# Patient Record
Sex: Female | Born: 1950 | Race: White | Hispanic: No | Marital: Married | Smoking: Never smoker
Health system: Southern US, Community
[De-identification: ages and names within clinical notes are randomized; demographics above are authoritative.]

## PROBLEM LIST (undated history)

## (undated) HISTORY — PX: TONSILLECTOMY: SUR1361

## (undated) HISTORY — PX: OOPHORECTOMY: SHX86

---

## 2016-04-20 DIAGNOSIS — M8580 Other specified disorders of bone density and structure, unspecified site: Secondary | ICD-10-CM | POA: Insufficient documentation

## 2016-07-26 DIAGNOSIS — N83201 Unspecified ovarian cyst, right side: Secondary | ICD-10-CM | POA: Insufficient documentation

## 2016-09-26 DIAGNOSIS — L570 Actinic keratosis: Secondary | ICD-10-CM | POA: Insufficient documentation

## 2016-09-26 DIAGNOSIS — H903 Sensorineural hearing loss, bilateral: Secondary | ICD-10-CM | POA: Insufficient documentation

## 2016-09-26 DIAGNOSIS — H60549 Acute eczematoid otitis externa, unspecified ear: Secondary | ICD-10-CM | POA: Insufficient documentation

## 2016-10-01 LAB — HM MAMMOGRAPHY

## 2017-09-30 DIAGNOSIS — H3530 Unspecified macular degeneration: Secondary | ICD-10-CM | POA: Insufficient documentation

## 2017-10-16 LAB — HM DEXA SCAN

## 2017-10-16 LAB — HM MAMMOGRAPHY

## 2018-12-28 LAB — HM MAMMOGRAPHY

## 2019-11-01 LAB — HM DEXA SCAN

## 2019-12-30 LAB — HM MAMMOGRAPHY

## 2020-06-30 ENCOUNTER — Encounter (INDEPENDENT_AMBULATORY_CARE_PROVIDER_SITE_OTHER): Payer: Self-pay | Admitting: Ophthalmology

## 2020-07-03 ENCOUNTER — Other Ambulatory Visit: Payer: Self-pay

## 2020-07-03 ENCOUNTER — Encounter (INDEPENDENT_AMBULATORY_CARE_PROVIDER_SITE_OTHER): Payer: Medicare Other | Admitting: Ophthalmology

## 2020-07-03 DIAGNOSIS — H2513 Age-related nuclear cataract, bilateral: Secondary | ICD-10-CM | POA: Diagnosis not present

## 2020-07-03 DIAGNOSIS — H353132 Nonexudative age-related macular degeneration, bilateral, intermediate dry stage: Secondary | ICD-10-CM | POA: Diagnosis not present

## 2020-07-03 DIAGNOSIS — H43813 Vitreous degeneration, bilateral: Secondary | ICD-10-CM | POA: Diagnosis not present

## 2020-09-27 ENCOUNTER — Other Ambulatory Visit: Payer: Self-pay

## 2020-09-27 ENCOUNTER — Encounter (INDEPENDENT_AMBULATORY_CARE_PROVIDER_SITE_OTHER): Payer: Medicare Other | Admitting: Ophthalmology

## 2020-09-27 DIAGNOSIS — H2513 Age-related nuclear cataract, bilateral: Secondary | ICD-10-CM | POA: Diagnosis not present

## 2020-09-27 DIAGNOSIS — H43813 Vitreous degeneration, bilateral: Secondary | ICD-10-CM | POA: Diagnosis not present

## 2020-09-27 DIAGNOSIS — H353132 Nonexudative age-related macular degeneration, bilateral, intermediate dry stage: Secondary | ICD-10-CM

## 2020-11-06 ENCOUNTER — Other Ambulatory Visit: Payer: Self-pay | Admitting: Urgent Care

## 2020-11-06 DIAGNOSIS — Z1231 Encounter for screening mammogram for malignant neoplasm of breast: Secondary | ICD-10-CM

## 2020-11-24 ENCOUNTER — Encounter (INDEPENDENT_AMBULATORY_CARE_PROVIDER_SITE_OTHER): Payer: Medicare Other | Admitting: Ophthalmology

## 2020-11-24 ENCOUNTER — Other Ambulatory Visit: Payer: Self-pay

## 2020-11-24 DIAGNOSIS — H353132 Nonexudative age-related macular degeneration, bilateral, intermediate dry stage: Secondary | ICD-10-CM

## 2020-11-24 DIAGNOSIS — H43813 Vitreous degeneration, bilateral: Secondary | ICD-10-CM | POA: Diagnosis not present

## 2020-11-24 DIAGNOSIS — H2513 Age-related nuclear cataract, bilateral: Secondary | ICD-10-CM | POA: Diagnosis not present

## 2021-01-01 ENCOUNTER — Encounter (INDEPENDENT_AMBULATORY_CARE_PROVIDER_SITE_OTHER): Payer: Medicare Other | Admitting: Ophthalmology

## 2021-01-05 ENCOUNTER — Encounter (INDEPENDENT_AMBULATORY_CARE_PROVIDER_SITE_OTHER): Payer: Medicare Other | Admitting: Ophthalmology

## 2021-01-05 ENCOUNTER — Other Ambulatory Visit: Payer: Self-pay

## 2021-01-05 DIAGNOSIS — H33301 Unspecified retinal break, right eye: Secondary | ICD-10-CM | POA: Diagnosis not present

## 2021-01-05 DIAGNOSIS — H2513 Age-related nuclear cataract, bilateral: Secondary | ICD-10-CM | POA: Diagnosis not present

## 2021-01-05 DIAGNOSIS — H353132 Nonexudative age-related macular degeneration, bilateral, intermediate dry stage: Secondary | ICD-10-CM

## 2021-01-05 DIAGNOSIS — H43811 Vitreous degeneration, right eye: Secondary | ICD-10-CM

## 2021-01-23 ENCOUNTER — Other Ambulatory Visit: Payer: Self-pay

## 2021-01-23 ENCOUNTER — Encounter (INDEPENDENT_AMBULATORY_CARE_PROVIDER_SITE_OTHER): Payer: Medicare Other | Admitting: Ophthalmology

## 2021-01-23 DIAGNOSIS — H33301 Unspecified retinal break, right eye: Secondary | ICD-10-CM

## 2021-05-23 ENCOUNTER — Encounter (INDEPENDENT_AMBULATORY_CARE_PROVIDER_SITE_OTHER): Payer: Medicare Other | Admitting: Ophthalmology

## 2021-05-23 ENCOUNTER — Other Ambulatory Visit: Payer: Self-pay

## 2021-05-23 DIAGNOSIS — H353132 Nonexudative age-related macular degeneration, bilateral, intermediate dry stage: Secondary | ICD-10-CM

## 2021-05-23 DIAGNOSIS — H33301 Unspecified retinal break, right eye: Secondary | ICD-10-CM

## 2021-05-23 DIAGNOSIS — H43813 Vitreous degeneration, bilateral: Secondary | ICD-10-CM

## 2021-07-03 ENCOUNTER — Encounter (INDEPENDENT_AMBULATORY_CARE_PROVIDER_SITE_OTHER): Payer: Medicare Other | Admitting: Ophthalmology

## 2021-10-17 ENCOUNTER — Other Ambulatory Visit (HOSPITAL_COMMUNITY)
Admission: RE | Admit: 2021-10-17 | Discharge: 2021-10-17 | Disposition: A | Discharge status: Home / Self Care | Payer: Medicare Other | Source: Ambulatory Visit | Attending: Family Medicine | Admitting: Family Medicine

## 2021-10-17 ENCOUNTER — Emergency Department
Admission: EM | Admit: 2021-10-17 | Discharge: 2021-10-17 | Disposition: A | Discharge status: Home / Self Care | Payer: Medicare Other | Source: Home / Self Care | Attending: Family Medicine | Admitting: Family Medicine

## 2021-10-17 ENCOUNTER — Other Ambulatory Visit: Payer: Self-pay

## 2021-10-17 DIAGNOSIS — N72 Inflammatory disease of cervix uteri: Secondary | ICD-10-CM | POA: Insufficient documentation

## 2021-10-17 MED ORDER — METRONIDAZOLE 500 MG PO TABS: 500.0000 mg | ORAL_TABLET | Freq: Two times a day (BID) | ORAL | 0 refills | Status: DC

## 2021-10-17 NOTE — ED Triage Notes (Signed)
Pt states that she has some vaginal discharge. X2 days ? ?Pt states that she notices she has the brownish discharge more in the morning when she wakes up. ?

## 2021-10-17 NOTE — ED Provider Notes (Signed)
?[facility] ? ? ? ?CSN: [id number] ?Arrival date & time: 10/17/21  1751 ? ? ?  ? ?History   ?Chief Complaint ?Chief Complaint  ?Patient presents with  ? Vaginal Discharge  ?  Vaginal discharge. X2 days  ? ? ?HPI ?Christy Cruz is a 71 y.o. female.  ? ?HPI ?Pleasant 71 year old woman in good health.  States she had menopause at the age of 74.  She has had an oophorectomy for an ovarian cyst.  She is on no prescription medications, married, retired in [place]. ?She is here because for the last 4 days she has had vaginal discharge.  It is a brown discharge, copious in the morning.  She has been wearing a pad and it is stained.  There is no itching, no odor, no discomfort.  She has never had this previously. ?She denies any abdominal pain.  No urinary symptoms.  No fever or chills.  No malaise ?History reviewed. No pertinent past medical history. ? ?There are no problems to display for this patient. ? ? ?Past Surgical History:  ?Procedure Laterality Date  ? OOPHORECTOMY    ? TONSILLECTOMY    ? ? ?OB History   ?No obstetric history on file. ?  ? ? ? ?Home Medications   ? ?Prior to Admission medications   ?Medication Sig Start Date End Date Taking? Authorizing Provider  ?glucosamine-chondroitin 500-400 MG tablet Take 1 tablet by mouth 3 (three) times daily.   Yes [provider]  ?metroNIDAZOLE (FLAGYL) 500 MG tablet Take 1 tablet (500 mg total) by mouth 2 (two) times daily. 10/17/21  Yes Raylene Everts, MD  ?Multiple Vitamins-Minerals (ICAPS AREDS 2 PO) ICaps AREDS ? one PO daily   Yes [provider]  ?Omega-3 Fatty Acids (FISH OIL) 1200 MG CAPS 1 capsule   Yes [provider]  ? ? ?Family History ?History reviewed. No pertinent family history. ? ?Social History ?Social History  ? ?Tobacco Use  ? Smoking status: Never  ? Smokeless tobacco: Never  ?Substance Use Topics  ? Alcohol use: Never  ? Drug use: Never  ? ? ? ?Allergies   ?Patient has no known allergies. ? ? ?Review of  Systems ?Review of Systems ?See HPI ? ?Physical Exam ?Triage Vital Signs ?ED Triage Vitals  ?Enc Vitals Group  ?   BP 10/17/21 0956 133/84  ?   Pulse Rate 10/17/21 0956 83  ?   Resp 10/17/21 0956 18  ?   Temp 10/17/21 0956 98.8 ?F (37.1 ?C)  ?   Temp Source 10/17/21 0956 Oral  ?   SpO2 10/17/21 0956 100 %  ?   Weight 10/17/21 0952 120 lb (54.4 kg)  ?   Height 10/17/21 0952 '5\' 5"'$  (1.651 m)  ?   Head Circumference --   ?   Peak Flow --   ?   Pain Score 10/17/21 0952 0  ?   Pain Loc --   ?   Pain Edu? --   ?   Excl. in [place]? --   ? ?No data found. ? ?Updated Vital Signs ?BP 133/84 (BP Location: Right Arm)   Pulse 83   Temp 98.8 ?F (37.1 ?C) (Oral)   Resp 18   Ht '5\' 5"'$  (1.651 m)   Wt 54.4 kg   SpO2 100%   BMI 19.97 kg/m?  ? ?   ?Physical Exam ?Constitutional:   ?   General: She is not in acute distress. ?   Appearance: She is well-developed  and normal weight.  ?HENT:  ?   Head: Normocephalic and atraumatic.  ?   Mouth/Throat:  ?   Comments: Mask is in place ?Eyes:  ?   Conjunctiva/sclera: Conjunctivae normal.  ?   Pupils: Pupils are equal, round, and reactive to light.  ?Cardiovascular:  ?   Rate and Rhythm: Normal rate.  ?Pulmonary:  ?   Effort: Pulmonary effort is normal. No respiratory distress.  ?Abdominal:  ?   General: Abdomen is flat. There is no distension.  ?   Palpations: Abdomen is soft.  ?   Tenderness: There is no abdominal tenderness.  ?Genitourinary: ?   General: Normal vulva.  ?   Exam position: Lithotomy position.  ?   Vagina: Vaginal discharge present.  ?   Comments: Erythema at urethral meatus.  External genitalia normal.  Atrophic changes are evident.  External hemorrhoids are present, nontender.  No bleeding.  Vaginal exam reveals copious thin brown discharge.  Heme positive.  Sent for Gram stain and culture.  ?Musculoskeletal:     ?   General: Normal range of motion.  ?   Cervical back: Normal range of motion.  ?Skin: ?   General: Skin is warm and dry.  ?Neurological:  ?   Mental Status: She  is alert.  ?Psychiatric:     ?   Mood and Affect: Mood normal.     ?   Behavior: Behavior normal.  ? ? ? ?UC Treatments / Results  ?Labs ?(all labs ordered are listed, but only abnormal results are displayed) ?Labs Reviewed  ?BODY FLUID CULTURE W GRAM STAIN  ?CERVICOVAGINAL ANCILLARY ONLY  ? ? ?EKG ? ? ?Radiology ?No results found. ? ?Procedures ?Procedures (including critical care time) ? ?Medications Ordered in UC ?Medications - No data to display ? ?Initial Impression / Assessment and Plan / UC Course  ?I have reviewed the triage vital signs and the nursing notes. ? ?Pertinent labs & imaging results that were available during my care of the patient were reviewed by me and considered in my medical decision making (see chart for details). ? ?  ? ?I discussed with the patient that her cervix appeared to have nabothian cysts and was irregular.  It was also friable.  Vaginal walls were unremarkable.  She has no abdominal tenderness to suggest endometritis.  We will send cultures and treat empirically with metronidazole.  May need to follow-up with GYN if problem persists ?Final Clinical Impressions(s) / UC Diagnoses  ? ?Final diagnoses:  ?Cervicitis  ? ? ? ?Discharge Instructions   ? ?  ?I am prescribing metronidazole ?This is used for general bacterial vaginitis ?The test results will be available in 2-3 days ?You can see results in My Chart ?Follow up with GYN if problem persists ? ? ?ED Prescriptions   ? ? Medication Sig Dispense Auth. Provider  ? metroNIDAZOLE (FLAGYL) 500 MG tablet Take 1 tablet (500 mg total) by mouth 2 (two) times daily. 14 tablet Raylene Everts, MD  ? ?  ? ?PDMP not reviewed this encounter. ?  ?Raylene Everts, MD ?10/17/21 1108 ? ?

## 2021-10-17 NOTE — Discharge Instructions (Signed)
I am prescribing metronidazole ?This is used for general bacterial vaginitis ?The test results will be available in 2-3 days ?You can see results in My Chart ?Follow up with GYN if problem persists ?

## 2021-10-18 LAB — CERVICOVAGINAL ANCILLARY ONLY
Bacterial Vaginitis (gardnerella): NEGATIVE
Candida Glabrata: NEGATIVE
Candida Vaginitis: NEGATIVE
Chlamydia: NEGATIVE
Comment: NEGATIVE
Comment: NEGATIVE
Comment: NEGATIVE
Comment: NEGATIVE
Comment: NEGATIVE
Comment: NORMAL
Neisseria Gonorrhea: NEGATIVE
Trichomonas: NEGATIVE

## 2021-10-22 ENCOUNTER — Telehealth: Payer: Self-pay | Admitting: Family Medicine

## 2021-10-22 ENCOUNTER — Encounter (HOSPITAL_BASED_OUTPATIENT_CLINIC_OR_DEPARTMENT_OTHER): Payer: Self-pay | Admitting: Emergency Medicine

## 2021-10-22 ENCOUNTER — Other Ambulatory Visit: Payer: Self-pay

## 2021-10-22 ENCOUNTER — Emergency Department (HOSPITAL_BASED_OUTPATIENT_CLINIC_OR_DEPARTMENT_OTHER): Payer: Medicare Other

## 2021-10-22 ENCOUNTER — Inpatient Hospital Stay (HOSPITAL_BASED_OUTPATIENT_CLINIC_OR_DEPARTMENT_OTHER)
Admission: EM | Admit: 2021-10-22 | Discharge: 2021-10-25 | DRG: 872 | Disposition: A | Discharge status: Home / Self Care | Payer: Medicare Other | Attending: Internal Medicine | Admitting: Internal Medicine

## 2021-10-22 DIAGNOSIS — L03116 Cellulitis of left lower limb: Secondary | ICD-10-CM | POA: Diagnosis present

## 2021-10-22 DIAGNOSIS — G8929 Other chronic pain: Secondary | ICD-10-CM | POA: Diagnosis present

## 2021-10-22 DIAGNOSIS — D72829 Elevated white blood cell count, unspecified: Secondary | ICD-10-CM | POA: Diagnosis not present

## 2021-10-22 DIAGNOSIS — A419 Sepsis, unspecified organism: Secondary | ICD-10-CM | POA: Diagnosis present

## 2021-10-22 DIAGNOSIS — R000 Tachycardia, unspecified: Secondary | ICD-10-CM | POA: Diagnosis present

## 2021-10-22 DIAGNOSIS — E872 Acidosis, unspecified: Secondary | ICD-10-CM | POA: Diagnosis present

## 2021-10-22 DIAGNOSIS — Z20822 Contact with and (suspected) exposure to covid-19: Secondary | ICD-10-CM | POA: Diagnosis present

## 2021-10-22 DIAGNOSIS — L0390 Cellulitis, unspecified: Secondary | ICD-10-CM | POA: Diagnosis present

## 2021-10-22 DIAGNOSIS — D696 Thrombocytopenia, unspecified: Secondary | ICD-10-CM | POA: Diagnosis present

## 2021-10-22 DIAGNOSIS — M79605 Pain in left leg: Secondary | ICD-10-CM | POA: Diagnosis not present

## 2021-10-22 DIAGNOSIS — D649 Anemia, unspecified: Secondary | ICD-10-CM | POA: Diagnosis present

## 2021-10-22 DIAGNOSIS — I728 Aneurysm of other specified arteries: Secondary | ICD-10-CM | POA: Diagnosis present

## 2021-10-22 DIAGNOSIS — G43909 Migraine, unspecified, not intractable, without status migrainosus: Secondary | ICD-10-CM | POA: Diagnosis present

## 2021-10-22 DIAGNOSIS — M7989 Other specified soft tissue disorders: Secondary | ICD-10-CM | POA: Diagnosis present

## 2021-10-22 DIAGNOSIS — M542 Cervicalgia: Secondary | ICD-10-CM | POA: Diagnosis present

## 2021-10-22 DIAGNOSIS — Z79899 Other long term (current) drug therapy: Secondary | ICD-10-CM | POA: Diagnosis not present

## 2021-10-22 DIAGNOSIS — I959 Hypotension, unspecified: Secondary | ICD-10-CM | POA: Diagnosis present

## 2021-10-22 DIAGNOSIS — N898 Other specified noninflammatory disorders of vagina: Secondary | ICD-10-CM | POA: Diagnosis present

## 2021-10-22 DIAGNOSIS — R6520 Severe sepsis without septic shock: Secondary | ICD-10-CM | POA: Diagnosis present

## 2021-10-22 DIAGNOSIS — L538 Other specified erythematous conditions: Secondary | ICD-10-CM | POA: Diagnosis not present

## 2021-10-22 DIAGNOSIS — R112 Nausea with vomiting, unspecified: Secondary | ICD-10-CM

## 2021-10-22 LAB — URINALYSIS, ROUTINE W REFLEX MICROSCOPIC
Bilirubin Urine: NEGATIVE
Glucose, UA: NEGATIVE mg/dL
Hgb urine dipstick: NEGATIVE
Ketones, ur: NEGATIVE mg/dL
Nitrite: NEGATIVE
Specific Gravity, Urine: 1.045 — ABNORMAL HIGH (ref 1.005–1.030)
pH: 8 (ref 5.0–8.0)

## 2021-10-22 LAB — COMPREHENSIVE METABOLIC PANEL
ALT: 14 U/L (ref 0–44)
AST: 24 U/L (ref 15–41)
Albumin: 4.6 g/dL (ref 3.5–5.0)
Alkaline Phosphatase: 71 U/L (ref 38–126)
Anion gap: 13 (ref 5–15)
BUN: 16 mg/dL (ref 8–23)
CO2: 20 mmol/L — ABNORMAL LOW (ref 22–32)
Calcium: 9.3 mg/dL (ref 8.9–10.3)
Chloride: 104 mmol/L (ref 98–111)
Creatinine, Ser: 0.83 mg/dL (ref 0.44–1.00)
GFR, Estimated: >60 mL/min (ref >60)
Glucose, Bld: 83 mg/dL (ref 70–99)
Potassium: 4.8 mmol/L (ref 3.5–5.1)
Sodium: 137 mmol/L (ref 135–145)
Total Bilirubin: 1.5 mg/dL — ABNORMAL HIGH (ref 0.3–1.2)
Total Protein: 7.9 g/dL (ref 6.5–8.1)

## 2021-10-22 LAB — LIPASE, BLOOD: Lipase: 32 U/L (ref 11–51)

## 2021-10-22 LAB — RESP PANEL BY RT-PCR (FLU A&B, COVID) ARPGX2
Influenza A by PCR: NEGATIVE
Influenza B by PCR: NEGATIVE
SARS Coronavirus 2 by RT PCR: NEGATIVE

## 2021-10-22 LAB — CBC
HCT: 46.5 % — ABNORMAL HIGH (ref 36.0–46.0)
Hemoglobin: 14.9 g/dL (ref 12.0–15.0)
MCH: 29 pg (ref 26.0–34.0)
MCHC: 32 g/dL (ref 30.0–36.0)
MCV: 90.5 fL (ref 80.0–100.0)
Platelets: 157 10*3/uL (ref 150–400)
RBC: 5.14 MIL/uL — ABNORMAL HIGH (ref 3.87–5.11)
RDW: 13.3 % (ref 11.5–15.5)
WBC: 14.7 10*3/uL — ABNORMAL HIGH (ref 4.0–10.5)
nRBC: 0 % (ref 0.0–0.2)

## 2021-10-22 LAB — LACTIC ACID, PLASMA
Lactic Acid, Venous: 2.9 mmol/L (ref 0.5–1.9)
Lactic Acid, Venous: 3.5 mmol/L (ref 0.5–1.9)
Lactic Acid, Venous: 3.7 mmol/L (ref 0.5–1.9)

## 2021-10-22 LAB — DIFFERENTIAL
Abs Immature Granulocytes: 0.08 10*3/uL — ABNORMAL HIGH (ref 0.00–0.07)
Basophils Absolute: 0 10*3/uL (ref 0.0–0.1)
Basophils Relative: 0 %
Eosinophils Absolute: 0 10*3/uL (ref 0.0–0.5)
Eosinophils Relative: 0 %
Immature Granulocytes: 1 %
Lymphocytes Relative: 2 %
Lymphs Abs: 0.3 10*3/uL — ABNORMAL LOW (ref 0.7–4.0)
Monocytes Absolute: 0.9 10*3/uL (ref 0.1–1.0)
Monocytes Relative: 6 %
Neutro Abs: 13.1 10*3/uL — ABNORMAL HIGH (ref 1.7–7.7)
Neutrophils Relative %: 91 %

## 2021-10-22 LAB — PROTIME-INR
INR: 1 (ref 0.8–1.2)
Prothrombin Time: 12.9 seconds (ref 11.4–15.2)

## 2021-10-22 LAB — APTT: aPTT: 26 seconds (ref 24–36)

## 2021-10-22 MED ORDER — FAMOTIDINE IN NACL 20-0.9 MG/50ML-% IV SOLN
20.0000 mg | Freq: Once | INTRAVENOUS | Status: AC
  Administered 2021-10-22: 20 mg via INTRAVENOUS
  Filled 2021-10-22: qty 50

## 2021-10-22 MED ORDER — LIDOCAINE VISCOUS HCL 2 % MT SOLN
15.0000 mL | Freq: Once | OROMUCOSAL | Status: AC
Start: 2021-10-22 — End: 2021-10-22
  Administered 2021-10-22: 15 mL via ORAL
  Filled 2021-10-22: qty 15

## 2021-10-22 MED ORDER — ACETAMINOPHEN 325 MG PO TABS: 650.0000 mg | ORAL_TABLET | Freq: Four times a day (QID) | ORAL | Status: DC | PRN

## 2021-10-22 MED ORDER — ENOXAPARIN SODIUM 40 MG/0.4ML IJ SOSY
40.0000 mg | PREFILLED_SYRINGE | INTRAMUSCULAR | Status: DC
  Administered 2021-10-23 – 2021-10-24 (×2): 40 mg via SUBCUTANEOUS
  Filled 2021-10-22 (×2): qty 0.4

## 2021-10-22 MED ORDER — ONDANSETRON HCL 4 MG/2ML IJ SOLN
4.0000 mg | Freq: Once | INTRAMUSCULAR | Status: AC
Start: 2021-10-22 — End: 2021-10-22
  Administered 2021-10-22: 4 mg via INTRAVENOUS
  Filled 2021-10-22: qty 2

## 2021-10-22 MED ORDER — ONDANSETRON 4 MG PO TBDP
4.0000 mg | ORAL_TABLET | Freq: Once | ORAL | Status: AC
  Administered 2021-10-22: 4 mg via ORAL
  Filled 2021-10-22: qty 1

## 2021-10-22 MED ORDER — ALUM & MAG HYDROXIDE-SIMETH 200-200-20 MG/5ML PO SUSP
30.0000 mL | Freq: Once | ORAL | Status: AC
Start: 2021-10-22 — End: 2021-10-22
  Administered 2021-10-22: 30 mL via ORAL
  Filled 2021-10-22: qty 30

## 2021-10-22 MED ORDER — PENICILLIN V POTASSIUM 500 MG PO TABS: 500.0000 mg | ORAL_TABLET | Freq: Two times a day (BID) | ORAL | 0 refills | Status: DC

## 2021-10-22 MED ORDER — LACTATED RINGERS IV BOLUS
1000.0000 mL | Freq: Once | INTRAVENOUS | Status: AC
Start: 2021-10-22 — End: 2021-10-22
  Administered 2021-10-22: 1000 mL via INTRAVENOUS

## 2021-10-22 MED ORDER — LACTATED RINGERS IV SOLN: INTRAVENOUS | Status: AC

## 2021-10-22 MED ORDER — KETOROLAC TROMETHAMINE 15 MG/ML IJ SOLN
15.0000 mg | Freq: Once | INTRAMUSCULAR | Status: AC
  Administered 2021-10-22: 15 mg via INTRAVENOUS
  Filled 2021-10-22: qty 1

## 2021-10-22 MED ORDER — SODIUM CHLORIDE 0.9 % IV BOLUS
500.0000 mL | Freq: Once | INTRAVENOUS | Status: AC
  Administered 2021-10-22: 500 mL via INTRAVENOUS

## 2021-10-22 MED ORDER — SODIUM CHLORIDE 0.9 % IV SOLN
1.0000 g | INTRAVENOUS | Status: DC
  Administered 2021-10-22: 1 g via INTRAVENOUS
  Filled 2021-10-22: qty 10

## 2021-10-22 MED ORDER — ONDANSETRON HCL 4 MG/2ML IJ SOLN
4.0000 mg | Freq: Once | INTRAMUSCULAR | Status: AC
  Administered 2021-10-22: 4 mg via INTRAVENOUS
  Filled 2021-10-22: qty 2

## 2021-10-22 MED ORDER — ONDANSETRON HCL 4 MG/2ML IJ SOLN: 4.0000 mg | Freq: Four times a day (QID) | INTRAMUSCULAR | Status: DC | PRN

## 2021-10-22 MED ORDER — IOHEXOL 350 MG/ML SOLN
100.0000 mL | Freq: Once | INTRAVENOUS | Status: AC | PRN
  Administered 2021-10-22: 60 mL via INTRAVENOUS

## 2021-10-22 MED ORDER — ACETAMINOPHEN 325 MG PO TABS
650.0000 mg | ORAL_TABLET | Freq: Once | ORAL | Status: AC
  Administered 2021-10-22: 650 mg via ORAL
  Filled 2021-10-22: qty 2

## 2021-10-22 NOTE — Progress Notes (Signed)
RN notified on call provider Dr. Royal Piedra of critical Lactic Acid of 3.7 . ?

## 2021-10-22 NOTE — Progress Notes (Signed)
[facility] monitoring code sepsis 

## 2021-10-22 NOTE — ED Notes (Signed)
Patient transported to CT 

## 2021-10-22 NOTE — ED Provider Notes (Signed)
?[facility] ?Provider Note ? ? ?CSN: [id number] ?Arrival date & time: 10/22/21  1349 ? ?  ? ?History ? ?Chief Complaint  ?Patient presents with  ? Emesis  ? ? ?Christy Cruz is a 71 y.o. female. ? ?This is a 71 y.o. female with significant medical history as below, including tonsillectomy, oophorectomy who presents to the ED with complaint of left leg pain, nausea and vomiting.  ? ?Patient was seen urgent care recently, initially started on Flagyl for possible BV.  Was then transition to penicillin for possible UTI.  She picked up penicillin today but not started taking it.  She has been having ongoing nausea and vomiting.  Poor p.o. intake last 24 to 36 hours.  2 days ago noticed cut to her left leg, progressive redness, discomfort to the leg since then.  History of cellulitis.  Her tetanus is up-to-date.  She has been ambulatory but having some discomfort to the leg.  Vague abdominal discomfort.  No suspicious p.o. intake.  No recent travel or sick contacts.  No significant dysuria or hematuria.  No chest pain or dyspnea ? ? ? ?History reviewed. No pertinent past medical history. ? ?Past Surgical History: ?No date: OOPHORECTOMY ?No date: TONSILLECTOMY  ? ? ?The history is provided by the patient. No language interpreter was used.  ?Emesis ?Associated symptoms: abdominal pain   ?Associated symptoms: no chills, no cough, no fever and no headaches   ? ?  ? ?Home Medications ?Prior to Admission medications   ?Medication Sig Start Date End Date Taking? Authorizing Provider  ?glucosamine-chondroitin 500-400 MG tablet Take 1 tablet by mouth 3 (three) times daily.    [provider]  ?metroNIDAZOLE (FLAGYL) 500 MG tablet Take 1 tablet (500 mg total) by mouth 2 (two) times daily. 10/17/21   Raylene Everts, MD  ?Multiple Vitamins-Minerals (ICAPS AREDS 2 PO) ICaps AREDS ? one PO daily    [provider]  ?Omega-3 Fatty Acids (FISH OIL) 1200 MG CAPS 1 capsule    [provider]  ?penicillin v potassium (VEETID) 500 MG tablet Take 1 tablet (500 mg total) by mouth 2 (two) times daily for 10 days. 10/22/21 11/01/21  Raylene Everts, MD  ?   ? ?Allergies    ?Patient has no known allergies.   ? ?Review of Systems   ?Review of Systems  ?Constitutional:  Negative for chills and fever.  ?HENT:  Negative for facial swelling and trouble swallowing.   ?Eyes:  Negative for photophobia and visual disturbance.  ?Respiratory:  Negative for cough and shortness of breath.   ?Cardiovascular:  Negative for chest pain and palpitations.  ?Gastrointestinal:  Positive for abdominal pain, nausea and vomiting.  ?Endocrine: Negative for polydipsia and polyuria.  ?Genitourinary:  Negative for difficulty urinating and hematuria.  ?Musculoskeletal:  Negative for gait problem and joint swelling.  ?Skin:  Positive for rash. Negative for pallor.  ?Neurological:  Negative for syncope and headaches.  ?Psychiatric/Behavioral:  Negative for agitation and confusion.   ? ?Physical Exam ?Updated Vital Signs ?BP 112/68 (BP Location: Right Arm)   Pulse 98   Temp (!) 100.9 ?F (38.3 ?C) (Oral)   Resp 18   Ht '5\' 5"'$  (1.651 m)   Wt 62.6 kg   SpO2 99%   BMI 22.97 kg/m?  ?Physical Exam ?Vitals and nursing note reviewed.  ?Constitutional:   ?   General: She is not in acute distress. ?   Appearance: She is not diaphoretic.  ?HENT:  ?  Head: Normocephalic and atraumatic.  ?   Right Ear: External ear normal.  ?   Left Ear: External ear normal.  ?   Nose: Nose normal.  ?   Mouth/Throat:  ?   Mouth: Mucous membranes are moist.  ?Eyes:  ?   General: No scleral icterus.    ?   Right eye: No discharge.     ?   Left eye: No discharge.  ?Cardiovascular:  ?   Rate and Rhythm: Normal rate and regular rhythm.  ?   Pulses: Normal pulses.  ?   Heart sounds: Normal heart sounds.  ?Pulmonary:  ?   Effort: Pulmonary effort is normal. No respiratory distress.  ?   Breath sounds: Normal breath sounds.  ?Abdominal:  ?   General:  Abdomen is flat.  ?   Palpations: Abdomen is soft.  ?   Tenderness: There is no abdominal tenderness.  ?Musculoskeletal:     ?   General: Normal range of motion.  ?   Cervical back: Normal range of motion.  ?   Right lower leg: No edema.  ?   Left lower leg: No edema.  ?Skin: ?   General: Skin is warm and dry.  ?   Capillary Refill: Capillary refill takes less than 2 seconds.  ? ?    ?   Comments: Erythema, induration. ? ?2+ DP pulses equal bilateral.  Normal ROM to bilateral lower extremities.  Strength equal bilateral  ?Neurological:  ?   Mental Status: She is alert.  ?Psychiatric:     ?   Mood and Affect: Mood normal.     ?   Behavior: Behavior normal.  ? ? ?ED Results / Procedures / Treatments   ?Labs ?(all labs ordered are listed, but only abnormal results are displayed) ?Labs Reviewed  ?COMPREHENSIVE METABOLIC PANEL - Abnormal; Notable for the following components:  ?    Result Value  ? CO2 20 (*)   ? Total Bilirubin 1.5 (*)   ? All other components within normal limits  ?CBC - Abnormal; Notable for the following components:  ? WBC 14.7 (*)   ? RBC 5.14 (*)   ? HCT 46.5 (*)   ? All other components within normal limits  ?URINALYSIS, ROUTINE W REFLEX MICROSCOPIC - Abnormal; Notable for the following components:  ? Specific Gravity, Urine 1.045 (*)   ? Protein, ur TRACE (*)   ? Leukocytes,Ua TRACE (*)   ? All other components within normal limits  ?LACTIC ACID, PLASMA - Abnormal; Notable for the following components:  ? Lactic Acid, Venous 2.9 (*)   ? All other components within normal limits  ?LACTIC ACID, PLASMA - Abnormal; Notable for the following components:  ? Lactic Acid, Venous 3.5 (*)   ? All other components within normal limits  ?DIFFERENTIAL - Abnormal; Notable for the following components:  ? Neutro Abs 13.1 (*)   ? Lymphs Abs 0.3 (*)   ? Abs Immature Granulocytes 0.08 (*)   ? All other components within normal limits  ?LACTIC ACID, PLASMA - Abnormal; Notable for the following components:  ? Lactic  Acid, Venous 3.7 (*)   ? All other components within normal limits  ?RESP PANEL BY RT-PCR (FLU A&B, COVID) [id number]  ?CULTURE, BLOOD (ROUTINE X 2)  ?CULTURE, BLOOD (ROUTINE X 2)  ?LIPASE, BLOOD  ?PROTIME-INR  ?APTT  ?LACTIC ACID, PLASMA  ?CBC  ?LACTIC ACID, PLASMA  ?LACTIC ACID, PLASMA  ? ? ?EKG ?None ? ?Radiology ?CT ABDOMEN PELVIS  W CONTRAST ? ?Result Date: 10/22/2021 ?CLINICAL DATA:  Vomiting. EXAM: CT ABDOMEN AND PELVIS WITH CONTRAST TECHNIQUE: Multidetector CT imaging of the abdomen and pelvis was performed using the standard protocol following bolus administration of intravenous contrast. RADIATION DOSE REDUCTION: This exam was performed according to the departmental dose-optimization program which includes automated exposure control, adjustment of the mA and/or kV according to patient size and/or use of iterative reconstruction technique. CONTRAST:  30m OMNIPAQUE IOHEXOL 350 MG/ML SOLN COMPARISON:  None. FINDINGS: Lower chest: No acute abnormality. Hepatobiliary: A 9 mm diameter simple cyst is seen within the anteromedial aspect of the right lobe of the liver. No gallstones, gallbladder wall thickening, or biliary dilatation. Pancreas: Unremarkable. No pancreatic ductal dilatation or surrounding inflammatory changes. Spleen: Normal in size without focal abnormality. Adrenals/Urinary Tract: Adrenal glands are unremarkable. 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 Bladder is unremarkable. Stomach/Bowel: Stomach is within normal limits. Appendix appears normal. No evidence of bowel wall thickening, distention, or inflammatory changes. 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 No additional significant vascular findings are present. No enlarged abdominal or pelvic lymph nodes. Reproductive: Uterus and bilateral adnexa are unremarkable. Other: No abdominal wall hernia or  abnormality. No abdominopelvic ascites. Musculoskeletal: No acute or significant osseous findings. IMPRESSION: 1. No acute abnormality within the abdomen or pelvis. Electronically Signed   By: TVirgina NorfolkM.D.   On:

## 2021-10-22 NOTE — ED Notes (Signed)
Pt does have a rash left lower leg, warm to touch, not painful.  Pt reports picking at a raised area on left leg with a needle to possibly remove a splinter, rash appeared today.   ?

## 2021-10-22 NOTE — Telephone Encounter (Signed)
Review of the vaginal culture reveals strep a ?I called the patient she states she still has vaginal discharge but otherwise feels well ?I advised her to stop her metronidazole and start penicillin. ?Prescription called into pharmacy ?Follow-up if fails to improve ?

## 2021-10-22 NOTE — Assessment & Plan Note (Addendum)
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  Recommend continue follow-up outpatient ?

## 2021-10-22 NOTE — ED Notes (Signed)
Report given to [place] @ [facility] ? ?

## 2021-10-22 NOTE — Progress Notes (Signed)
Notified bedside nurse of need to draw repeat lactic acid at 1924.  ?

## 2021-10-22 NOTE — ED Notes (Signed)
[facility] here to transport pt 

## 2021-10-22 NOTE — H&P (Signed)
?History and Physical  ? ? ?Patient: Christy Cruz VPX:[id number] DOB: 14-Mar-1951 ?DOA: 10/22/2021 ?DOS: the patient was seen and examined on 10/22/2021 ?PCP: Patient, No Pcp Per (Inactive)  ?[facility] ? ?Chief Complaint:  ?Chief Complaint  ?Patient presents with  ? Emesis  ? ?HPI: Christy Cruz is a 71 y.o. female with past medical history of migraine who presented as a transfer for cellulitis.   ? ?She reports that yesterday she noticed a small eraser sized hard bump to her left lower extremity.  She placed a needle into it and found small wood splintered piece and applied antibiotic ointment to the wound.  Denies any drainage or purulence.  Today when she awoke she noticed diffuse redness around the wound.  Also having chills and had up to 7 episodes of vomiting. ?She recently was on a course of Flagyl for vaginal discharge and later had vaginal culture that grew strep.  She was switched to penicillin today and has not yet picked up new prescription. ? ?In the ED, she was febrile up to 101.46F, normotensive, on room air.  Had leukocytosis of 14.7, hemoglobin 14.9.  Lactate up to 3.5.  CMP unremarkable other than mildly elevated bilirubin at 1.5. ? ?UA with trace leukocyte, negative nitrite and elevated specific gravity of urine at 1.045 ? ?CT of the abdomen/pelvis showed no acute findings. ? ?She was started on IV Rocephin and given 30 cc/kg fluid for sepsis.  Transfer to [facility] was requested. ? ?Review of Systems: As mentioned in the history of present illness. All other systems reviewed and are negative. ?History reviewed. No pertinent past medical history. ?Past Surgical History:  ?Procedure Laterality Date  ? OOPHORECTOMY    ? TONSILLECTOMY    ? ?Social History:  reports that she has never smoked. She has never used smokeless tobacco. She reports that she does not drink alcohol and does not use drugs. ? ?No Known Allergies ? ?No family history on file. ? ?Prior to  Admission medications   ?Medication Sig Start Date End Date Taking? Authorizing Provider  ?glucosamine-chondroitin 500-400 MG tablet Take 1 tablet by mouth 3 (three) times daily.    [provider]  ?metroNIDAZOLE (FLAGYL) 500 MG tablet Take 1 tablet (500 mg total) by mouth 2 (two) times daily. 10/17/21   Raylene Everts, MD  ?Multiple Vitamins-Minerals (ICAPS AREDS 2 PO) ICaps AREDS ? one PO daily    [provider]  ?Omega-3 Fatty Acids (FISH OIL) 1200 MG CAPS 1 capsule    [provider]  ?penicillin v potassium (VEETID) 500 MG tablet Take 1 tablet (500 mg total) by mouth 2 (two) times daily for 10 days. 10/22/21 11/01/21  Raylene Everts, MD  ? ? ?Physical Exam: ?Vitals:  ? 10/22/21 2030 10/22/21 2039 10/22/21 2132 10/22/21 2210  ?BP: (!) 124/59  112/68   ?Pulse: (!) 103  99 98  ?Resp: (!) 23  18   ?Temp:  (!) 101.1 ?F (38.4 ?C) (!) 100.9 ?F (38.3 ?C)   ?TempSrc:  Oral Oral   ?SpO2: 100%  99%   ?Weight:   62.6 kg   ?Height:      ? ?Constitutional: NAD, calm, comfortable, nontoxic appearing elderly female laying flat in bed ?Eyes:  lids and conjunctivae normal ?ENMT: Mucous membranes are dry  ?Neck: normal, supple ?Respiratory: clear to auscultation bilaterally, no wheezing, no crackles. Normal respiratory effort. No accessory muscle use.  ?Cardiovascular: Regular rate and rhythm, no murmurs / rubs /  gallops. No extremity edema. 2+ pedal pulses.  ?Abdomen: no tenderness,  Bowel sounds positive.  ?Musculoskeletal: no clubbing / cyanosis. No joint deformity upper and lower extremities. Good ROM, no contractures. Normal muscle tone.  ?Skin: Small 2 cm scabbed lesion to left lateral leg proximal to the knee with surrounding erythema and increased warmth with palpation ?Neurologic: CN 2-12 grossly intact. Strength 5/5 in all 4.  ?Psychiatric: Normal judgment and insight. Alert and oriented x 3. Normal mood. ?Data Reviewed: ? ?See HPI ? ?Assessment and Plan: ?Sepsis ([place]) ?- Presented with  fever, leukocytosis with findings of left lower extremity cellulitis ?-Has received 30 cc/kg sepsis fluid.  Continuous aggressive IV fluid overnight ?- Continue IV Rocephin ?-Continue to trend lactate ? ?Vaginal discharge ?Recently given a course of Flagyl for suspected BV by PCP and reportedly had culture with strep and was transition to penicillin today. ?-Already on coverage with IV Rocephin as above ? 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  Recommend continue follow-up outpatient. ? ? ? ? ? Advance Care Planning:   Code Status: Full Code  ? ?Consults: None ? ?Family Communication: Discussed with husband at bedside ? ?Severity of Illness: ?The appropriate patient status for this patient is INPATIENT. Inpatient status is judged to be reasonable and necessary in order to provide the required intensity of service to ensure the patient's safety. The patient's presenting symptoms, physical exam findings, and initial radiographic and laboratory data in the context of their chronic comorbidities is felt to place them at high risk for further clinical deterioration. Furthermore, it is not anticipated that the patient will be medically stable for discharge from the hospital within 2 midnights of admission.  ? ?* I certify that at the point of admission it is my clinical judgment that the patient will require inpatient hospital care spanning beyond 2 midnights from the point of admission due to high intensity of service, high risk for further deterioration and high frequency of surveillance required.* ? ?Author: Orene Desanctis, DO ?10/22/2021 11:35 PM ? ?For on call review [email or web address].  ?

## 2021-10-22 NOTE — ED Triage Notes (Addendum)
Started to vomit today she states  denies dysuria  or abd pain  no diarrhea , also has a bump on leg that she wants looked at also  states was called today and told she  strep in her urine, picked up rx today but has not taken it ?

## 2021-10-22 NOTE — ED Notes (Signed)
Report given to [facility] ? ?

## 2021-10-22 NOTE — Assessment & Plan Note (Addendum)
Initially in severe sepsis which has now resolved ? ?

## 2021-10-22 NOTE — Assessment & Plan Note (Addendum)
Patient will be going home on oral Keflex.  Patient is not complaining of vaginal discharge anymore ?

## 2021-10-22 NOTE — Sepsis Progress Note (Signed)
With elink Monitoring Sepsis Protocol, Current Provider aware of ^ Lactic, MD ordered additional Lactic at this time. ?

## 2021-10-23 ENCOUNTER — Inpatient Hospital Stay (HOSPITAL_COMMUNITY): Payer: Medicare Other

## 2021-10-23 DIAGNOSIS — N898 Other specified noninflammatory disorders of vagina: Secondary | ICD-10-CM | POA: Diagnosis not present

## 2021-10-23 DIAGNOSIS — L538 Other specified erythematous conditions: Secondary | ICD-10-CM | POA: Diagnosis not present

## 2021-10-23 DIAGNOSIS — D72829 Elevated white blood cell count, unspecified: Secondary | ICD-10-CM

## 2021-10-23 DIAGNOSIS — D696 Thrombocytopenia, unspecified: Secondary | ICD-10-CM

## 2021-10-23 DIAGNOSIS — E872 Acidosis, unspecified: Secondary | ICD-10-CM

## 2021-10-23 DIAGNOSIS — D649 Anemia, unspecified: Secondary | ICD-10-CM

## 2021-10-23 DIAGNOSIS — L03116 Cellulitis of left lower limb: Secondary | ICD-10-CM | POA: Diagnosis not present

## 2021-10-23 DIAGNOSIS — M7989 Other specified soft tissue disorders: Secondary | ICD-10-CM

## 2021-10-23 DIAGNOSIS — I728 Aneurysm of other specified arteries: Secondary | ICD-10-CM | POA: Diagnosis not present

## 2021-10-23 DIAGNOSIS — M79605 Pain in left leg: Secondary | ICD-10-CM | POA: Diagnosis not present

## 2021-10-23 LAB — CBC
HCT: 35.9 % — ABNORMAL LOW (ref 36.0–46.0)
Hemoglobin: 11.8 g/dL — ABNORMAL LOW (ref 12.0–15.0)
MCH: 29.9 pg (ref 26.0–34.0)
MCHC: 32.9 g/dL (ref 30.0–36.0)
MCV: 90.9 fL (ref 80.0–100.0)
Platelets: 130 10*3/uL — ABNORMAL LOW (ref 150–400)
RBC: 3.95 MIL/uL (ref 3.87–5.11)
RDW: 13.4 % (ref 11.5–15.5)
WBC: 12.7 10*3/uL — ABNORMAL HIGH (ref 4.0–10.5)
nRBC: 0 % (ref 0.0–0.2)

## 2021-10-23 LAB — LACTIC ACID, PLASMA
Lactic Acid, Venous: 4.5 mmol/L (ref 0.5–1.9)
Lactic Acid, Venous: 2.5 mmol/L (ref 0.5–1.9)

## 2021-10-23 MED ORDER — LACTATED RINGERS IV BOLUS
250.0000 mL | Freq: Once | INTRAVENOUS | Status: AC
  Administered 2021-10-23: 250 mL via INTRAVENOUS

## 2021-10-23 MED ORDER — VANCOMYCIN HCL IN DEXTROSE 1-5 GM/200ML-% IV SOLN
1000.0000 mg | Freq: Once | INTRAVENOUS | Status: AC
  Administered 2021-10-23: 1000 mg via INTRAVENOUS
  Filled 2021-10-23: qty 200

## 2021-10-23 MED ORDER — NAPROXEN 500 MG PO TABS
500.0000 mg | ORAL_TABLET | Freq: Every day | ORAL | Status: DC | PRN
  Administered 2021-10-24: 500 mg via ORAL
  Filled 2021-10-23: qty 1

## 2021-10-23 MED ORDER — SODIUM CHLORIDE 0.9 % IV SOLN
2.0000 g | INTRAVENOUS | Status: DC
  Administered 2021-10-23 – 2021-10-24 (×2): 2 g via INTRAVENOUS
  Filled 2021-10-23 (×3): qty 20

## 2021-10-23 MED ORDER — SUMATRIPTAN-NAPROXEN SODIUM 85-500 MG PO TABS: 1.0000 | ORAL_TABLET | Freq: Every day | ORAL | Status: DC | PRN

## 2021-10-23 MED ORDER — LACTATED RINGERS IV SOLN: INTRAVENOUS | Status: AC

## 2021-10-23 MED ORDER — VANCOMYCIN HCL IN DEXTROSE 1-5 GM/200ML-% IV SOLN
1000.0000 mg | INTRAVENOUS | Status: DC
  Administered 2021-10-24: 1000 mg via INTRAVENOUS
  Filled 2021-10-23: qty 200

## 2021-10-23 MED ORDER — TRAMADOL HCL 50 MG PO TABS
50.0000 mg | ORAL_TABLET | Freq: Four times a day (QID) | ORAL | Status: DC | PRN
  Administered 2021-10-24: 50 mg via ORAL
  Filled 2021-10-23: qty 1

## 2021-10-23 MED ORDER — SUMATRIPTAN SUCCINATE 50 MG PO TABS
75.0000 mg | ORAL_TABLET | Freq: Every day | ORAL | Status: DC | PRN
  Administered 2021-10-23 – 2021-10-24 (×2): 75 mg via ORAL
  Filled 2021-10-23 (×3): qty 1

## 2021-10-23 NOTE — Progress Notes (Signed)
?PROGRESS NOTE ? ? ? ?Christy Cruz  EQA:[id number] DOB: [birth year] DOA: 10/22/2021 ?PCP: Patient, No Pcp Per (Inactive)  ? ?Brief Narrative:  ?71 year old female with history of migraine presented with worsening left lower extremity redness and swelling with pain along with chills and vomiting.  On presentation, she was febrile with leukocytosis and lactic acidosis.  CT of the abdomen and pelvis showed no acute findings.  She was started on IV Rocephin and fluids. ? ?Assessment & Plan: ?  ?Severe sepsis: Present on admission ?Left lower extremity cellulitis ?Leukocytosis ?Lactic acidosis: Improving ?-Presented with fever, tachycardia, hypotension, leukocytosis, lactic acidosis with possible left lower extremity cellulitis ?-Chest x-ray/CT of the abdomen and pelvis/UA unremarkable.  Influenza/COVID-19 testing negative on presentation.  Blood pressure improving.  Tachycardia also improving.  Leukocytosis improving.  Continue Rocephin.  Add vancomycin.  Check left lower extremity duplex ultrasound. ? ?Thrombocytopenia ?-Questionable cause.  No signs of bleeding.  Monitor ? ?Normocytic anemia ?-Questionable cause.  Monitor ? ?Vaginal discharge ?-Recently given a course of Flagyl for suspected BV by PCP and reportedly had culture which grew strep pyogenes and patient was supposed to be started on penicillin as an outpatient yesterday ?-Outpatient follow-up with gynecology. ? 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  Recommend continue follow-up outpatient. ? ?DVT prophylaxis: Lovenox ?Code Status: Full ?Family Communication: Husband at bedside ?Disposition Plan: ?Status is: Inpatient ?Remains inpatient appropriate because: Of severity of illness.  Need for IV fluids and antibiotics. ? ? ? ?Consultants: None ? ?Procedures: None ? ?Antimicrobials:  ?Anti-infectives (From admission, onward)  ? ? Start     Dose/Rate Route Frequency Ordered Stop   ? 10/24/21 1000  vancomycin (VANCOCIN) IVPB 1000 mg/200 mL premix       ? 1,000 mg ?200 mL/hr over 60 Minutes Intravenous Every 24 hours 10/23/21 0923    ? 10/23/21 1400  cefTRIAXone (ROCEPHIN) 2 g in sodium chloride 0.9 % 100 mL IVPB       ? 2 g ?200 mL/hr over 30 Minutes Intravenous Every 24 hours 10/23/21 0727 10/29/21 1359  ? 10/23/21 0945  vancomycin (VANCOCIN) IVPB 1000 mg/200 mL premix       ? 1,000 mg ?200 mL/hr over 60 Minutes Intravenous  Once 10/23/21 0852    ? 10/22/21 1730  cefTRIAXone (ROCEPHIN) 1 g in sodium chloride 0.9 % 100 mL IVPB  Status:  Discontinued       ? 1 g ?200 mL/hr over 30 Minutes Intravenous Every 24 hours 10/22/21 1727 10/23/21 0727  ? ?  ? ? ? ?Subjective: ?Patient seen and examined at bedside.  Feels that her left lower extremity redness and swelling and pain are getting worse.  Also complains of some mild pinkish/of right lower extremity and bilateral upper extremities.  Denies any current nausea or vomiting.  Denies any chest pain or shortness of breath. ? ?Objective: ?Vitals:  ? 10/22/21 2132 10/22/21 2210 10/23/21 0202 10/23/21 0434  ?BP: 112/68  (!) 86/49 (!) 106/93  ?Pulse: 99 98 100 92  ?Resp: '18  18 18  '$ ?Temp: (!) 100.9 ?F (38.3 ?C)  99.2 ?F (37.3 ?C) 99.3 ?F (37.4 ?C)  ?TempSrc: Oral  Oral Oral  ?SpO2: 99%  94%   ?Weight: 62.6 kg     ?Height:      ? ? ?Intake/Output Summary (Last 24 hours) at 10/23/2021 0958 ?Last data filed at 10/23/2021 0056 ?Gross per 24 hour  ?Intake 150 ml  ?Output 400 ml  ?  Net -250 ml  ? ?Filed Weights  ? 10/22/21 1358 10/22/21 2132  ?Weight: 54 kg 62.6 kg  ? ? ?Examination: ? ?General exam: Appears calm and comfortable.  Currently on room air. ?Respiratory system: Bilateral decreased breath sounds at bases, no wheezing ?Cardiovascular system: S1 & S2 heard, Rate controlled ?Gastrointestinal system: Abdomen is nondistended, soft and nontender. Normal bowel sounds heard. ?Extremities: No cyanosis, clubbing; trace left lower extremity edema present   ?Central nervous system: Alert and oriented. No focal neurological deficits. Moving extremities ?Skin: Left lower (below mid shin area) extremity erythema, swelling, tenderness with no discharge present.  Very faint erythema of right lower extremity and bilateral upper extremities. ?Psychiatry: Judgement and insight appear normal. Mood & affect appropriate.  Intermittently looks anxious ? ? ? ?Data Reviewed: I have personally reviewed following labs and imaging studies ? ?CBC: ?Recent Labs  ?Lab 10/22/21 ?1409 10/23/21 ?0433  ?WBC 14.7* 12.7*  ?NEUTROABS 13.1*  --   ?HGB 14.9 11.8*  ?HCT 46.5* 35.9*  ?MCV 90.5 90.9  ?PLT 157 130*  ? ?Basic Metabolic Panel: ?Recent Labs  ?Lab 10/22/21 ?1409  ?NA 137  ?K 4.8  ?CL 104  ?CO2 20*  ?GLUCOSE 83  ?BUN 16  ?CREATININE 0.83  ?CALCIUM 9.3  ? ?GFR: ?Estimated Creatinine Clearance: 56.8 mL/min (by C-G formula based on SCr of 0.83 mg/dL). ?Liver Function Tests: ?Recent Labs  ?Lab 10/22/21 ?1409  ?AST 24  ?ALT 14  ?ALKPHOS 71  ?BILITOT 1.5*  ?PROT 7.9  ?ALBUMIN 4.6  ? ?Recent Labs  ?Lab 10/22/21 ?1409  ?LIPASE 32  ? ?No results for input(s): AMMONIA in the last 168 hours. ?Coagulation Profile: ?Recent Labs  ?Lab 10/22/21 ?1745  ?INR 1.0  ? ?Cardiac Enzymes: ?No results for input(s): CKTOTAL, CKMB, CKMBINDEX, TROPONINI in the last 168 hours. ?BNP (last 3 results) ?No results for input(s): PROBNP in the last 8760 hours. ?HbA1C: ?No results for input(s): HGBA1C in the last 72 hours. ?CBG: ?No results for input(s): GLUCAP in the last 168 hours. ?Lipid Profile: ?No results for input(s): CHOL, HDL, LDLCALC, TRIG, CHOLHDL, LDLDIRECT in the last 72 hours. ?Thyroid Function Tests: ?No results for input(s): TSH, T4TOTAL, FREET4, T3FREE, THYROIDAB in the last 72 hours. ?Anemia Panel: ?No results for input(s): VITAMINB12, FOLATE, FERRITIN, TIBC, IRON, RETICCTPCT in the last 72 hours. ?Sepsis Labs: ?Recent Labs  ?Lab 10/22/21 ?1924 10/22/21 ?2217 10/23/21 ?4010 10/23/21 ?0433  ?LATICACIDVEN  3.5* 3.7* 4.5* 2.5*  ? ? ?Recent Results (from the past 240 hour(s))  ?Anaerobic and Aerobic Culture     Status: Abnormal (Preliminary result)  ? Collection Time: 10/17/21 10:34 AM  ?Result Value Ref Range Status  ? MICRO NUMBER: [id number]  Preliminary  ? SPECIMEN QUALITY: Adequate  Preliminary  ? SOURCE: NOT GIVEN  Preliminary  ? STATUS: PRELIMINARY  Preliminary  ? AER ISOLATE 1: Streptococcus pyogenes (A)  Preliminary  ?  Comment: Moderate growth of Group A Streptococcus isolated Beta-hemolytic streptococci are predictably susceptible to Penicillin and other beta-lactams. Susceptibility testing not routinely performed. Please contact the laboratory within 3 days if susceptibility  ?testing is desired. ?  ? COMMENT:   Preliminary  ?  No source was provided. The specimen was tested and reported based upon the test code ordered. If this is incorrect, please contact client services.  ?Resp Panel by RT-PCR (Flu A&B, Covid) Nasopharyngeal Swab     Status: None  ? Collection Time: 10/22/21  6:08 PM  ? Specimen: Nasopharyngeal Swab; Nasopharyngeal(NP) swabs in vial transport medium  ?  Result Value Ref Range Status  ? SARS Coronavirus 2 by RT PCR NEGATIVE NEGATIVE Final  ?  Comment: (NOTE) ?SARS-CoV-2 target nucleic acids are NOT DETECTED. ? ?The SARS-CoV-2 RNA is generally detectable in upper respiratory ?specimens during the acute phase of infection. The lowest ?concentration of SARS-CoV-2 viral copies this assay can detect is ?138 copies/mL. A negative result does not preclude SARS-Cov-2 ?infection and should not be used as the sole basis for treatment or ?other patient management decisions. A negative result may occur with  ?improper specimen collection/handling, submission of specimen other ?than nasopharyngeal swab, presence of viral mutation(s) within the ?areas targeted by this assay, and inadequate number of viral ?copies(<138 copies/mL). A negative result must be combined with ?clinical observations, patient  history, and epidemiological ?information. The expected result is Negative. ? ?Fact Sheet for Patients:  ?EntrepreneurPulse.com.au ? ?Fact Sheet for Healthcare Providers:  ?FedLocator.es

## 2021-10-23 NOTE — Progress Notes (Signed)
Pharmacy Antibiotic Note ? ?Christy Cruz is a 71 y.o. female admitted on 10/22/2021 with cellulitis.  Pharmacy has been consulted for vancomycin dosing. ? ?Plan: ?Start vancomycin 1g IV Q24h ?Increase ceftriaxone to 2g IV Q24h ?Monitor clinical picture, renal function, vanc levels prn ?F/U C&S, abx deescalation / LOT ? ?Height: '5\' 5"'$  (165.1 cm) ?Weight: 62.6 kg (138 lb 0.1 oz) ?IBW/kg (Calculated) : 57 ? ?Temp (24hrs), Avg:99.7 ?F (37.6 ?C), Min:98 ?F (36.7 ?C), Max:101.1 ?F (38.4 ?C) ? ?Recent Labs  ?Lab 10/22/21 ?1409 10/22/21 ?1745 10/22/21 ?1924 10/22/21 ?2217 10/23/21 ?3428 10/23/21 ?0433  ?WBC 14.7*  --   --   --   --  12.7*  ?CREATININE 0.83  --   --   --   --   --   ?LATICACIDVEN  --  2.9* 3.5* 3.7* 4.5* 2.5*  ?  ?Estimated Creatinine Clearance: 56.8 mL/min (by C-G formula based on SCr of 0.83 mg/dL).   ? ?No Known Allergies ? ?Antimicrobials this admission: ?CTX 3/27 >> ?Vancomycin 3/28 >> ? ?Dose adjustments this admission: ? ? ?Microbiology results: ?3/27 BCx: sent ?Resp panel negative ? ?Thank you for allowing pharmacy to be a part of this patient?s care. ? ?Elenor Quinones, PharmD, BCPS, BCIDP ?Clinical Pharmacist ?10/23/2021 8:54 AM ? ? ?

## 2021-10-24 DIAGNOSIS — D72829 Elevated white blood cell count, unspecified: Secondary | ICD-10-CM | POA: Diagnosis not present

## 2021-10-24 DIAGNOSIS — R6520 Severe sepsis without septic shock: Secondary | ICD-10-CM

## 2021-10-24 DIAGNOSIS — D649 Anemia, unspecified: Secondary | ICD-10-CM

## 2021-10-24 DIAGNOSIS — I728 Aneurysm of other specified arteries: Secondary | ICD-10-CM | POA: Diagnosis not present

## 2021-10-24 DIAGNOSIS — E872 Acidosis, unspecified: Secondary | ICD-10-CM

## 2021-10-24 DIAGNOSIS — L03116 Cellulitis of left lower limb: Secondary | ICD-10-CM | POA: Diagnosis not present

## 2021-10-24 DIAGNOSIS — D696 Thrombocytopenia, unspecified: Secondary | ICD-10-CM

## 2021-10-24 LAB — COMPREHENSIVE METABOLIC PANEL
ALT: 15 U/L (ref 0–44)
AST: 20 U/L (ref 15–41)
Albumin: 2.9 g/dL — ABNORMAL LOW (ref 3.5–5.0)
Alkaline Phosphatase: 64 U/L (ref 38–126)
Anion gap: 6 (ref 5–15)
BUN: 13 mg/dL (ref 8–23)
CO2: 23 mmol/L (ref 22–32)
Calcium: 7.7 mg/dL — ABNORMAL LOW (ref 8.9–10.3)
Chloride: 108 mmol/L (ref 98–111)
Creatinine, Ser: 0.92 mg/dL (ref 0.44–1.00)
GFR, Estimated: >60 mL/min (ref >60)
Glucose, Bld: 128 mg/dL — ABNORMAL HIGH (ref 70–99)
Potassium: 3.6 mmol/L (ref 3.5–5.1)
Sodium: 137 mmol/L (ref 135–145)
Total Bilirubin: 1.3 mg/dL — ABNORMAL HIGH (ref 0.3–1.2)
Total Protein: 5.5 g/dL — ABNORMAL LOW (ref 6.5–8.1)

## 2021-10-24 LAB — ROCKY MTN SPOTTED FVR ABS PNL(IGG+IGM)
RMSF IgG: NEGATIVE
RMSF IgM: 0.36 index (ref 0.00–0.89)

## 2021-10-24 LAB — CBC WITH DIFFERENTIAL/PLATELET
Abs Immature Granulocytes: 0.06 10*3/uL (ref 0.00–0.07)
Basophils Absolute: 0 10*3/uL (ref 0.0–0.1)
Basophils Relative: 0 %
Eosinophils Absolute: 0.3 10*3/uL (ref 0.0–0.5)
Eosinophils Relative: 2 %
HCT: 35.6 % — ABNORMAL LOW (ref 36.0–46.0)
Hemoglobin: 11.7 g/dL — ABNORMAL LOW (ref 12.0–15.0)
Immature Granulocytes: 1 %
Lymphocytes Relative: 5 %
Lymphs Abs: 0.6 10*3/uL — ABNORMAL LOW (ref 0.7–4.0)
MCH: 29.6 pg (ref 26.0–34.0)
MCHC: 32.9 g/dL (ref 30.0–36.0)
MCV: 90.1 fL (ref 80.0–100.0)
Monocytes Absolute: 0.4 10*3/uL (ref 0.1–1.0)
Monocytes Relative: 3 %
Neutro Abs: 9.9 10*3/uL — ABNORMAL HIGH (ref 1.7–7.7)
Neutrophils Relative %: 89 %
Platelets: 134 10*3/uL — ABNORMAL LOW (ref 150–400)
RBC: 3.95 MIL/uL (ref 3.87–5.11)
RDW: 13.5 % (ref 11.5–15.5)
WBC: 11.2 10*3/uL — ABNORMAL HIGH (ref 4.0–10.5)
nRBC: 0 % (ref 0.0–0.2)

## 2021-10-24 LAB — MAGNESIUM: Magnesium: 2 mg/dL (ref 1.7–2.4)

## 2021-10-24 LAB — MRSA NEXT GEN BY PCR, NASAL: MRSA by PCR Next Gen: NOT DETECTED

## 2021-10-24 MED ORDER — SODIUM CHLORIDE 0.9 % IV SOLN: INTRAVENOUS | Status: DC

## 2021-10-24 MED ORDER — DM-GUAIFENESIN ER 30-600 MG PO TB12: 1.0000 | ORAL_TABLET | Freq: Two times a day (BID) | ORAL | Status: DC | PRN

## 2021-10-24 MED ORDER — SENNOSIDES-DOCUSATE SODIUM 8.6-50 MG PO TABS: 1.0000 | ORAL_TABLET | Freq: Every evening | ORAL | Status: DC | PRN

## 2021-10-24 MED ORDER — HYDRALAZINE HCL 20 MG/ML IJ SOLN: 10.0000 mg | INTRAMUSCULAR | Status: DC | PRN

## 2021-10-24 MED ORDER — METOPROLOL TARTRATE 5 MG/5ML IV SOLN: 5.0000 mg | INTRAVENOUS | Status: DC | PRN

## 2021-10-24 MED ORDER — IPRATROPIUM-ALBUTEROL 0.5-2.5 (3) MG/3ML IN SOLN: 3.0000 mL | RESPIRATORY_TRACT | Status: DC | PRN

## 2021-10-24 MED ORDER — OXYCODONE HCL 5 MG PO TABS: 5.0000 mg | ORAL_TABLET | ORAL | Status: DC | PRN

## 2021-10-24 MED ORDER — TRAZODONE HCL 50 MG PO TABS
50.0000 mg | ORAL_TABLET | Freq: Every evening | ORAL | Status: DC | PRN
Start: 2021-10-24 — End: 2021-10-25

## 2021-10-24 NOTE — Assessment & Plan Note (Addendum)
Left lower extremity cellulitis has significantly improved on IV Rocephin.  Lower extremity Dopplers is negative for DVT.  [facility] spotted fever panel came back negative.  We will transition her to oral Keflex and have her follow-up outpatient PCP. ?

## 2021-10-24 NOTE — Assessment & Plan Note (Signed)
Around baseline of 11.5 ?

## 2021-10-24 NOTE — Assessment & Plan Note (Signed)
Improving with fluids ?

## 2021-10-24 NOTE — Assessment & Plan Note (Signed)
Improving.

## 2021-10-24 NOTE — Assessment & Plan Note (Signed)
Possibly from underlying sepsis.  No evidence of bleeding continue to monitor ?

## 2021-10-24 NOTE — Progress Notes (Signed)
?PROGRESS NOTE ? ? ? ?Christy Cruz  SJG:[id number] DOB: May 08, 1951 DOA: 10/22/2021 ?PCP: Patient, No Pcp Per (Inactive)  ? ?Brief Narrative:  ?71 year old with history of migraine comes to the hospital with worsening left lower extremity pain, fevers and chills.  She was noted to be in severe sepsis with leukocytosis and elevated lactate.  CT of the abdomen pelvis was negative for any acute findings.  She was started on IV Rocephin, vancomycin and IV fluids. ? ? ?Assessment & Plan: ? Principal Problem: ?  Cellulitis ?Active Problems: ?  Severe sepsis ([place]) ?  Vaginal discharge ?  Thrombocytopenia ([place]) ?  Aneurysm, splenic artery ([facility]) ?  Normocytic anemia ?  Leukocytosis ?  Lactic acidosis ?  ? ?Assessment and Plan: ?* Cellulitis ?Left lower extremity. ?Dopplers-negative for DVT ?Ongoing treatment with Rocephin and d/c vancomycin ? ?Severe sepsis ([place]) ?Presented with fever, leukocytosis with findings of left lower extremity cellulitis.  Sepsis physiology is improving.  Continue IV fluids.  Empiric IV Rocephin and vancomycin. ? ? ?Thrombocytopenia ([place]) ?Possibly from underlying sepsis.  No evidence of bleeding continue to monitor ? ?Vaginal discharge ?Recently given a course of Flagyl for suspected BV by PCP and reportedly had culture with strep and was transition to penicillin today. ?-Already on coverage with IV Rocephin as above ? ?Lactic acidosis ?Improving with fluids ? ?Leukocytosis ?Improving ? ?Normocytic anemia ?Around baseline of 11.5 ? 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  Recommend continue follow-up outpatient ? ? ? ?DVT prophylaxis: Lovenox ?Code Status: Full code ?Family Communication:  Husband at bedside.  ? ?Status is: Inpatient ?Remains inpatient appropriate because: Continue IV fluids and IV antibiotics until improvement in sepsis physiology and her lower extremity infection. ? ? ?Subjective: ?Still has  pain and redness of her LLE.  ?She also reports of neck pain.  ? ?Examination: ? ?General exam: Appears calm and comfortable  ?Respiratory system: Clear to auscultation. Respiratory effort normal. ?Cardiovascular system: S1 & S2 heard, RRR. No JVD, murmurs, rubs, gallops or clicks. No pedal edema. ?Gastrointestinal system: Abdomen is nondistended, soft and nontender. No organomegaly or masses felt. Normal bowel sounds heard. ?Central nervous system: Alert and oriented. No focal neurological deficits. ?Extremities: Symmetric 5 x 5 power. ?Skin: LLE erythema and pain.  ?Psychiatry: Judgement and insight appear normal. Mood & affect appropriate.  ? ? ? ?Objective: ?Vitals:  ? 10/23/21 1332 10/23/21 2134 10/24/21 0426 10/24/21 0806  ?BP: (!) 98/50 113/63 116/69 118/66  ?Pulse: 95 93 91 87  ?Resp: '16 16 16 18  '$ ?Temp: 99.7 ?F (37.6 ?C) 99.7 ?F (37.6 ?C) 99.9 ?F (37.7 ?C) 98 ?F (36.7 ?C)  ?TempSrc: Oral Oral Oral Oral  ?SpO2: 98% 93% 93% 92%  ?Weight:      ?Height:      ? ? ?Intake/Output Summary (Last 24 hours) at 10/24/2021 1020 ?Last data filed at 10/23/2021 1500 ?Gross per 24 hour  ?Intake 917.64 ml  ?Output --  ?Net 917.64 ml  ? ?Filed Weights  ? 10/22/21 1358 10/22/21 2132  ?Weight: 54 kg 62.6 kg  ? ? ? ?Data Reviewed:  ? ?CBC: ?Recent Labs  ?Lab 10/22/21 ?1409 10/23/21 ?0433 10/24/21 ?6546  ?WBC 14.7* 12.7* 11.2*  ?NEUTROABS 13.1*  --  9.9*  ?HGB 14.9 11.8* 11.7*  ?HCT 46.5* 35.9* 35.6*  ?MCV 90.5 90.9 90.1  ?PLT 157 130* 134*  ? ?Basic Metabolic Panel: ?Recent Labs  ?Lab 10/22/21 ?1409 10/24/21 ?5035  ?NA  137 137  ?K 4.8 3.6  ?CL 104 108  ?CO2 20* 23  ?GLUCOSE 83 128*  ?BUN 16 13  ?CREATININE 0.83 0.92  ?CALCIUM 9.3 7.7*  ?MG  --  2.0  ? ?GFR: ?Estimated Creatinine Clearance: 51.2 mL/min (by C-G formula based on SCr of 0.92 mg/dL). ?Liver Function Tests: ?Recent Labs  ?Lab 10/22/21 ?1409 10/24/21 ?7673  ?AST 24 20  ?ALT 14 15  ?ALKPHOS 71 64  ?BILITOT 1.5* 1.3*  ?PROT 7.9 5.5*  ?ALBUMIN 4.6 2.9*  ? ?Recent Labs  ?Lab  10/22/21 ?1409  ?LIPASE 32  ? ?No results for input(s): AMMONIA in the last 168 hours. ?Coagulation Profile: ?Recent Labs  ?Lab 10/22/21 ?1745  ?INR 1.0  ? ?Cardiac Enzymes: ?No results for input(s): CKTOTAL, CKMB, CKMBINDEX, TROPONINI in the last 168 hours. ?BNP (last 3 results) ?No results for input(s): PROBNP in the last 8760 hours. ?HbA1C: ?No results for input(s): HGBA1C in the last 72 hours. ?CBG: ?No results for input(s): GLUCAP in the last 168 hours. ?Lipid Profile: ?No results for input(s): CHOL, HDL, LDLCALC, TRIG, CHOLHDL, LDLDIRECT in the last 72 hours. ?Thyroid Function Tests: ?No results for input(s): TSH, T4TOTAL, FREET4, T3FREE, THYROIDAB in the last 72 hours. ?Anemia Panel: ?No results for input(s): VITAMINB12, FOLATE, FERRITIN, TIBC, IRON, RETICCTPCT in the last 72 hours. ?Sepsis Labs: ?Recent Labs  ?Lab 10/22/21 ?1924 10/22/21 ?2217 10/23/21 ?4193 10/23/21 ?0433  ?LATICACIDVEN 3.5* 3.7* 4.5* 2.5*  ? ? ?Recent Results (from the past 240 hour(s))  ?Anaerobic and Aerobic Culture     Status: Abnormal  ? Collection Time: 10/17/21 10:34 AM  ?Result Value Ref Range Status  ? MICRO NUMBER: [id number]  Final  ? SPECIMEN QUALITY: Adequate  Final  ? SOURCE: BODY FLUID  Corrected  ? STATUS: ADDENDUM - FINAL  Corrected  ? AER ISOLATE 1: Streptococcus pyogenes (AA)  Final  ?  Comment: Heavy growth of Group A Streptococcus isolated Beta-hemolytic streptococci are predictably susceptible to Penicillin and other beta-lactams. Susceptibility testing not routinely performed. Please contact the laboratory within 3 days if susceptibility  ?testing is desired. ?  ? COMMENT:   Final  ?  No source was provided. The specimen was tested and reported based upon the test code ordered. If this is incorrect, please contact client services.  ?Resp Panel by RT-PCR (Flu A&B, Covid) Nasopharyngeal Swab     Status: None  ? Collection Time: 10/22/21  6:08 PM  ? Specimen: Nasopharyngeal Swab; Nasopharyngeal(NP) swabs in vial transport  medium  ?Result Value Ref Range Status  ? SARS Coronavirus 2 by RT PCR NEGATIVE NEGATIVE Final  ?  Comment: (NOTE) ?SARS-CoV-2 target nucleic acids are NOT DETECTED. ? ?The SARS-CoV-2 RNA is generally detectable in upper respiratory ?specimens during the acute phase of infection. The lowest ?concentration of SARS-CoV-2 viral copies this assay can detect is ?138 copies/mL. A negative result does not preclude SARS-Cov-2 ?infection and should not be used as the sole basis for treatment or ?other patient management decisions. A negative result may occur with  ?improper specimen collection/handling, submission of specimen other ?than nasopharyngeal swab, presence of viral mutation(s) within the ?areas targeted by this assay, and inadequate number of viral ?copies(<138 copies/mL). A negative result must be combined with ?clinical observations, patient history, and epidemiological ?information. The expected result is Negative. ? ?Fact Sheet for Patients:  ?EntrepreneurPulse.com.au ? ?Fact Sheet for Healthcare Providers:  ?IncredibleEmployment.be ? ?This test is no t yet approved or cleared by the [place] FDA and  ?has  been authorized for detection and/or diagnosis of SARS-CoV-2 by ?FDA under an Emergency Use Authorization (EUA). This EUA will remain  ?in effect (meaning this test can be used) for the duration of the ?COVID-19 declaration under Section 564(b)(1) of the Act, 21 ?U.S.C.section 360bbb-3(b)(1), unless the authorization is terminated  ?or revoked sooner.  ? ? ?  ? Influenza A by PCR NEGATIVE NEGATIVE Final  ? Influenza B by PCR NEGATIVE NEGATIVE Final  ?  Comment: (NOTE) ?The Xpert Xpress SARS-CoV-2/FLU/RSV plus assay is intended as an aid ?in the diagnosis of influenza from Nasopharyngeal swab specimens and ?should not be used as a sole basis for treatment. Nasal washings and ?aspirates are unacceptable for Xpert Xpress SARS-CoV-2/FLU/RSV ?testing. ? ?Fact Sheet for  Patients: ?EntrepreneurPulse.com.au ? ?Fact Sheet for Healthcare Providers: ?IncredibleEmployment.be ? ?This test is not yet approved or cleared by the [place]

## 2021-10-25 DIAGNOSIS — L03116 Cellulitis of left lower limb: Secondary | ICD-10-CM | POA: Diagnosis not present

## 2021-10-25 DIAGNOSIS — I728 Aneurysm of other specified arteries: Secondary | ICD-10-CM | POA: Diagnosis not present

## 2021-10-25 DIAGNOSIS — D72829 Elevated white blood cell count, unspecified: Secondary | ICD-10-CM | POA: Diagnosis not present

## 2021-10-25 DIAGNOSIS — M542 Cervicalgia: Secondary | ICD-10-CM

## 2021-10-25 DIAGNOSIS — E872 Acidosis, unspecified: Secondary | ICD-10-CM | POA: Diagnosis not present

## 2021-10-25 LAB — CBC
HCT: 34.8 % — ABNORMAL LOW (ref 36.0–46.0)
Hemoglobin: 11.5 g/dL — ABNORMAL LOW (ref 12.0–15.0)
MCH: 29.7 pg (ref 26.0–34.0)
MCHC: 33 g/dL (ref 30.0–36.0)
MCV: 89.9 fL (ref 80.0–100.0)
Platelets: 132 10*3/uL — ABNORMAL LOW (ref 150–400)
RBC: 3.87 MIL/uL (ref 3.87–5.11)
RDW: 13.5 % (ref 11.5–15.5)
WBC: 9.1 10*3/uL (ref 4.0–10.5)
nRBC: 0 % (ref 0.0–0.2)

## 2021-10-25 LAB — ANAEROBIC AND AEROBIC CULTURE
MICRO NUMBER:: 13164773
SPECIMEN QUALITY:: ADEQUATE

## 2021-10-25 LAB — BASIC METABOLIC PANEL
Anion gap: 4 — ABNORMAL LOW (ref 5–15)
BUN: 11 mg/dL (ref 8–23)
CO2: 23 mmol/L (ref 22–32)
Calcium: 7.4 mg/dL — ABNORMAL LOW (ref 8.9–10.3)
Chloride: 109 mmol/L (ref 98–111)
Creatinine, Ser: 0.68 mg/dL (ref 0.44–1.00)
GFR, Estimated: >60 mL/min (ref >60)
Glucose, Bld: 101 mg/dL — ABNORMAL HIGH (ref 70–99)
Potassium: 3.6 mmol/L (ref 3.5–5.1)
Sodium: 136 mmol/L (ref 135–145)

## 2021-10-25 LAB — MAGNESIUM: Magnesium: 1.9 mg/dL (ref 1.7–2.4)

## 2021-10-25 MED ORDER — CEPHALEXIN 500 MG PO CAPS
500.0000 mg | ORAL_CAPSULE | Freq: Four times a day (QID) | ORAL | 0 refills | Status: AC
Start: 2021-10-25 — End: 2021-11-01

## 2021-10-25 NOTE — Progress Notes (Signed)
Patient discharged to home with family, discharge instructions reviewed with patient who verbalized understanding. 

## 2021-10-25 NOTE — Progress Notes (Signed)
Pt saturation was 86 to 89% on RA. Pt was asleep and respiratory distress noted. Placed pt on 1 L via [place], her saturation is now 94%. On call provider notified and aware. ?

## 2021-10-25 NOTE — Discharge Summary (Signed)
Physician Discharge Summary  ?Christy Cruz:[id number] DOB: 1950/12/30 DOA: 10/22/2021 ? ?PCP: Patient, No Pcp Per (Inactive) ? ?Admit date: 10/22/2021 ?Discharge date: 10/25/2021 ? ?Admitted From: Home ?Disposition: Home ? ?Recommendations for Outpatient Follow-up:  ?Follow up with PCP in 1-2 weeks ?Please obtain BMP/CBC in one week your next doctors visit.  ?Oral Keflex has been prescribed. ? ? ?Discharge Condition: Stable ?CODE STATUS: Full code ?Diet recommendation: Regular ? ?Brief/Interim Summary: ?71 year old with history of migraine comes to the hospital with worsening left lower extremity pain, fevers and chills.  She was noted to be in severe sepsis with leukocytosis and elevated lactate.  CT of the abdomen pelvis was negative for any acute findings.  She was started on IV Rocephin, vancomycin and IV fluids.  Patient was eventually transitioned to IV Rocephin only and did very well, with significant improvement in her left lower extremity cellulitis.  Lower extremity Dopplers was negative for DVT.  Today she is medically stable for discharge on oral Keflex.  She has been advised to follow-up outpatient with PCP. ? ?She did report of chronic neck pain which has been ongoing for several years now.  I did offer MRI but patient and family refused at this time.  I advised him to follow-up outpatient.  My suspicion for cervical spine infection is very low at this time. ? ? ?Assessment and Plan: ?* Cellulitis ?Left lower extremity cellulitis has significantly improved on IV Rocephin.  Lower extremity Dopplers is negative for DVT.  [facility] spotted fever panel came back negative.  We will transition her to oral Keflex and have her follow-up outpatient PCP. ? ?Severe sepsis ([place]) ?Initially in severe sepsis which has now resolved ? ? ?Thrombocytopenia ([place]) ?Possibly from underlying sepsis.  No evidence of bleeding continue to monitor ? ?Vaginal discharge ?Patient will be going home on oral Keflex.  Patient is  not complaining of vaginal discharge anymore ? ?Neck pain ?This has been chronic for her and ongoing for several years.  No acute change at this time.  I offered MRI cervical spine but patient and family refused therefore I advised her to follow-up outpatient. ? ?Lactic acidosis ?Improving with fluids ? ?Leukocytosis ?Improving ? ?Normocytic anemia ?Around baseline of 11.5 ? 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  Recommend continue follow-up outpatient ? ? ? ? ?  ?Body mass index is 22.97 kg/m?. ? ?  ? ? ? ?Discharge Diagnoses:  ?Principal Problem: ?  Cellulitis ?Active Problems: ?  Severe sepsis ([place]) ?  Vaginal discharge ?  Thrombocytopenia ([place]) ?  Aneurysm, splenic artery (HCC) ?  Normocytic anemia ?  Leukocytosis ?  Lactic acidosis ?  Neck pain ? ? ? ?Subjective: ?Left lower extremity swelling and pain is significantly improved. ?Still having neck discomfort which has been very chronic for her.  Mostly this is in the right side of the neck going to her shoulder area and thinks it is a tight muscle. ? ?Discharge Exam: ?Vitals:  ? 10/25/21 0427 10/25/21 4401  ?BP: 121/73   ?Pulse: 73   ?Resp: 18   ?Temp: 97.9 ?F (36.6 ?C)   ?SpO2: 95% 95%  ? ?Vitals:  ? 10/24/21 2008 10/25/21 0036 10/25/21 0272 10/25/21 5366  ?BP: 129/73  121/73   ?Pulse: 81  73   ?Resp: 18  18   ?Temp: (!) 97.5 ?F (36.4 ?C)  97.9 ?F (36.6 ?C)   ?TempSrc: Oral  Oral   ?  SpO2: 94% 94% 95% 95%  ?Weight:      ?Height:      ? ? ?General: Pt is alert, awake, not in acute distress ?Cardiovascular: RRR, S1/S2 +, no rubs, no gallops ?Respiratory: CTA bilaterally, no wheezing, no rhonchi ?Abdominal: Soft, NT, ND, bowel sounds + ?Extremities: no edema, no cyanosis ? ?Discharge Instructions ? ? ?Allergies as of 10/25/2021   ?No Known Allergies ?  ? ?  ?Medication List  ?  ? ?STOP taking these medications   ? ?metroNIDAZOLE 500 MG tablet ?Commonly known as: FLAGYL ?   ?penicillin v potassium 500 MG tablet ?Commonly known as: VEETID ?  ? ?  ? ?TAKE these medications   ? ?cephALEXin 500 MG capsule ?Commonly known as: KEFLEX ?Take 1 capsule (500 mg total) by mouth 4 (four) times daily for 7 days. ?  ?Fish Oil Triple Strength 1400 MG Caps ?Take 1,400 mg by mouth every morning. ?  ?GLUCOSAMINE-CHONDROITIN PO ?Take 2 tablets by mouth every morning. ?  ?ICAPS AREDS 2 PO ?Take 1 capsule by mouth 2 (two) times daily. ?  ?SUMAtriptan-naproxen 85-500 MG tablet ?Commonly known as: TREXIMET ?Take 1 tablet by mouth daily as needed for migraine. ?  ? ?  ? ? ?No Known Allergies ? ?You were cared for by a hospitalist during your hospital stay. If you have any questions about your discharge medications or the care you received while you were in the hospital after you are discharged, you can call the unit and asked to speak with the hospitalist on call if the hospitalist that took care of you is not available. Once you are discharged, your primary care physician will handle any further medical issues. Please note that no refills for any discharge medications will be authorized once you are discharged, as it is imperative that you return to your primary care physician (or establish a relationship with a primary care physician if you do not have one) for your aftercare needs so that they can reassess your need for medications and monitor your lab values. ? ? ?Procedures/Studies: ?CT ABDOMEN PELVIS W CONTRAST ? ?Result Date: 10/22/2021 ?CLINICAL DATA:  Vomiting. EXAM: CT ABDOMEN AND PELVIS WITH CONTRAST TECHNIQUE: Multidetector CT imaging of the abdomen and pelvis was performed using the standard protocol following bolus administration of intravenous contrast. RADIATION DOSE REDUCTION: This exam was performed according to the departmental dose-optimization program which includes automated exposure control, adjustment of the mA and/or kV according to patient size and/or use of iterative reconstruction  technique. CONTRAST:  12m OMNIPAQUE IOHEXOL 350 MG/ML SOLN COMPARISON:  None. FINDINGS: Lower chest: No acute abnormality. Hepatobiliary: A 9 mm diameter simple cyst is seen within the anteromedial aspect of the right lobe of the liver. No gallstones, gallbladder wall thickening, or biliary dilatation. Pancreas: Unremarkable. No pancreatic ductal dilatation or surrounding inflammatory changes. Spleen: Normal in size without focal abnormality. Adrenals/Urinary Tract: Adrenal glands are unremarkable. 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 Bladder is unremarkable. Stomach/Bowel: Stomach is within normal limits. Appendix appears normal. No evidence of bowel wall thickening, distention, or inflammatory changes. 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 No additional significant vascular findings are present. No enlarged abdominal or pelvic lymph nodes. Reproductive: Uterus and bilateral adnexa are unremarkable. Other: No abdominal wall hernia or abnormality. No abdominopelvic ascites. Musculoskeletal: No acute or significant osseous findings. IMPRESSION: 1. No acute abnormality within the  abdomen or pelvis. Electronically Signed   By: Virgina Norfolk M.D.   On: 10/22/2021 18:33  ? ?DG Chest Port 1 View ? ?Result Date: 10/22/2021 ?CLINICAL DATA:  Questionable sepsis.  Evaluate for abnormality. EXAM: PORTABLE CHEST 1 VIEW COMPARISON:  None. FINDINGS: Cardiac silhouette and mediastinal contours are within normal limits. The lungs are clear. No pleural effusion or pneumothorax. No acute skeletal abnormality. IMPRESSION: No active disease. Electronically Signed   By: Yvonne Kendall M.D.   On: 10/22/2021 18:35  ? ?VAS [place] LOWER EXTREMITY VENOUS (DVT) ? ?Result Date: 10/23/2021 ? Lower Venous DVT Study Patient Name:  Christy Cruz  Date of Exam:   10/23/2021 Medical Rec #: [id number]        Accession #:    [id number] Date of Birth: [birth year]        Patient Gender: F Patient Age:   74 years Exam Location:  [facility] Procedure:      VAS [place] LOWER EXTREMITY VENOUS (DVT) Referring

## 2021-10-25 NOTE — Assessment & Plan Note (Signed)
This has been chronic for her and ongoing for several years.  No acute change at this time.  I offered MRI cervical spine but patient and family refused therefore I advised her to follow-up outpatient. ?

## 2021-10-27 LAB — CULTURE, BLOOD (ROUTINE X 2)
Culture: NO GROWTH
Culture: NO GROWTH
Special Requests: ADEQUATE
Special Requests: ADEQUATE

## 2021-11-01 ENCOUNTER — Encounter: Payer: Self-pay | Admitting: Physician Assistant

## 2021-11-01 ENCOUNTER — Ambulatory Visit (INDEPENDENT_AMBULATORY_CARE_PROVIDER_SITE_OTHER): Payer: Medicare Other | Admitting: Physician Assistant

## 2021-11-01 VITALS — BP 109/71 | HR 84 | Temp 98.2°F | Resp 18 | Ht 65.0 in | Wt 121.0 lb

## 2021-11-01 DIAGNOSIS — D649 Anemia, unspecified: Secondary | ICD-10-CM | POA: Diagnosis not present

## 2021-11-01 DIAGNOSIS — Z6820 Body mass index (BMI) 20.0-20.9, adult: Secondary | ICD-10-CM

## 2021-11-01 DIAGNOSIS — D696 Thrombocytopenia, unspecified: Secondary | ICD-10-CM

## 2021-11-01 DIAGNOSIS — I728 Aneurysm of other specified arteries: Secondary | ICD-10-CM

## 2021-11-01 DIAGNOSIS — B379 Candidiasis, unspecified: Secondary | ICD-10-CM | POA: Diagnosis not present

## 2021-11-01 DIAGNOSIS — K7689 Other specified diseases of liver: Secondary | ICD-10-CM

## 2021-11-01 DIAGNOSIS — M542 Cervicalgia: Secondary | ICD-10-CM

## 2021-11-01 DIAGNOSIS — Z8619 Personal history of other infectious and parasitic diseases: Secondary | ICD-10-CM

## 2021-11-01 DIAGNOSIS — T3695XA Adverse effect of unspecified systemic antibiotic, initial encounter: Secondary | ICD-10-CM

## 2021-11-01 DIAGNOSIS — D72829 Elevated white blood cell count, unspecified: Secondary | ICD-10-CM

## 2021-11-01 MED ORDER — FLUCONAZOLE 150 MG PO TABS: 150.0000 mg | ORAL_TABLET | Freq: Once | ORAL | 0 refills | Status: AC

## 2021-11-01 NOTE — Progress Notes (Signed)
? ?New Patient Office Visit ? ?Subjective:  ?Patient ID: Christy Cruz, female    DOB: 1951/07/02  Age: 71 y.o. MRN: [id number] ? ?CC:  ?Chief Complaint  ?Patient presents with  ? Hospitalization Follow-up  ?  Cellulitis  ? ? ?HPI ?[name] reports that she was hospitalized from March 27 through October 25, 2021.  Hospital course: ? ?Brief/Interim Summary: ?71 year old with history of migraine comes to the hospital with worsening left lower extremity pain, fevers and chills.  She was noted to be in severe sepsis with leukocytosis and elevated lactate.  CT of the abdomen pelvis was negative for any acute findings.  She was started on IV Rocephin, vancomycin and IV fluids.  Patient was eventually transitioned to IV Rocephin only and did very well, with significant improvement in her left lower extremity cellulitis.  Lower extremity Dopplers was negative for DVT.  Today she is medically stable for discharge on oral Keflex.  She has been advised to follow-up outpatient with PCP. ?  ?She did report of chronic neck pain which has been ongoing for several years now.  I did offer MRI but patient and family refused at this time.  I advised him to follow-up outpatient.  My suspicion for cervical spine infection is very low at this time. ?  ?  ?Assessment and Plan: ?* Cellulitis ?Left lower extremity cellulitis has significantly improved on IV Rocephin.  Lower extremity Dopplers is negative for DVT.  [facility] spotted fever panel came back negative.  We will transition her to oral Keflex and have her follow-up outpatient PCP. ?  ?Severe sepsis ([place]) ?Initially in severe sepsis which has now resolved ?  ?  ?Thrombocytopenia ([place]) ?Possibly from underlying sepsis.  No evidence of bleeding continue to monitor ?  ?Vaginal discharge ?Patient will be going home on oral Keflex.  Patient is not complaining of vaginal discharge anymore ?  ?Neck pain ?This has been chronic for her and ongoing for several years.  No acute change  at this time.  I offered MRI cervical spine but patient and family refused therefore I advised her to follow-up outpatient. ?  ?Lactic acidosis ?Improving with fluids ?  ?Leukocytosis ?Improving ?  ?Normocytic anemia ?Around baseline of 11.5 ?  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  Recommend continue follow-up outpatient ?  ? ?States today that she is still taking the Keflex and the doxycycline.  States that she does believe both of them will finish tomorrow.  States that she is feeling much better. ? ?States that she is concerned she may get a yeast infection from the antibiotics.  States that she is not currently experiencing any vaginal discharge or vaginal itching. ? ?States that she does not currently have a primary care provider, states that hers moved to urgent care and is no longer able to follow her. ? ?History reviewed. No pertinent past medical history. ? ?Past Surgical History:  ?Procedure Laterality Date  ? OOPHORECTOMY    ? TONSILLECTOMY    ? ? ?History reviewed. No pertinent family history. ? ?Social History  ? ?Socioeconomic History  ? Marital status: Married  ?  Spouse name: Not on file  ? Number of children: Not on file  ? Years of education: Not on file  ? Highest education level: Not on file  ?Occupational History  ? Not on file  ?Tobacco Use  ? Smoking status: Never  ? Smokeless tobacco: Never  ?  Substance and Sexual Activity  ? Alcohol use: Never  ? Drug use: Never  ? Sexual activity: Yes  ?Other Topics Concern  ? Not on file  ?Social History Narrative  ? Not on file  ? ?Social Determinants of Health  ? ?Financial Resource Strain: Not on file  ?Food Insecurity: Not on file  ?Transportation Needs: Not on file  ?Physical Activity: Not on file  ?Stress: Not on file  ?Social Connections: Not on file  ?Intimate Partner Violence: Not on file  ? ? ?ROS ?Review of Systems  ?Constitutional:  Negative for chills  and fever.  ?HENT: Negative.    ?Eyes: Negative.   ?Respiratory:  Negative for shortness of breath.   ?Cardiovascular:  Negative for chest pain.  ?Gastrointestinal: Negative.   ?Endocrine: Negative.   ?Genitourinary:  Negative for dysuria and vaginal discharge.  ?Musculoskeletal: Negative.   ?Skin: Negative.   ?Allergic/Immunologic: Negative.   ?Neurological: Negative.   ?Hematological: Negative.   ?Psychiatric/Behavioral: Negative.    ? ?Objective:  ? ?Today's Vitals: BP 109/71 (BP Location: Right Arm, Patient Position: Sitting, Cuff Size: Normal)   Pulse 84   Temp 98.2 ?F (36.8 ?C) (Oral)   Resp 18   Ht '5\' 5"'$  (1.651 m)   Wt 121 lb (54.9 kg)   SpO2 97%   BMI 20.14 kg/m?  ? ?Physical Exam ?Vitals and nursing note reviewed.  ?Constitutional:   ?   Appearance: Normal appearance.  ?HENT:  ?   Head: Normocephalic and atraumatic.  ?   Right Ear: External ear normal.  ?   Left Ear: External ear normal.  ?   Nose: Nose normal.  ?   Mouth/Throat:  ?   Mouth: Mucous membranes are moist.  ?   Pharynx: Oropharynx is clear.  ?Eyes:  ?   Extraocular Movements: Extraocular movements intact.  ?   Conjunctiva/sclera: Conjunctivae normal.  ?   Pupils: Pupils are equal, round, and reactive to light.  ?Cardiovascular:  ?   Rate and Rhythm: Normal rate and regular rhythm.  ?   Pulses: Normal pulses.  ?   Heart sounds: Normal heart sounds.  ?Pulmonary:  ?   Effort: Pulmonary effort is normal.  ?   Breath sounds: Normal breath sounds.  ?Musculoskeletal:     ?   General: Normal range of motion.  ?   Cervical back: Normal range of motion and neck supple.  ?Skin: ?   General: Skin is warm and dry.  ?Neurological:  ?   General: No focal deficit present.  ?   Mental Status: She is alert.  ?Psychiatric:     ?   Mood and Affect: Mood normal.     ?   Behavior: Behavior normal.     ?   Thought Content: Thought content normal.     ?   Judgment: Judgment normal.  ? ? ?Assessment & Plan:  ? ?Problem List Items Addressed This Visit   ? ?  ?  Cardiovascular and Mediastinum  ? Aneurysm, splenic artery (HCC)  ?  ? Hematopoietic and Hemostatic  ? Thrombocytopenia ([place])  ? Relevant Orders  ? CBC with Differential/Platelet  ?  ? Other  ? Normocytic anemia  ? Leukocytosis  ? Neck pain  ? History of sepsis - Primary  ? ?Other Visit Diagnoses   ? ? Hypocalcemia      ? Relevant Orders  ? Comp. Metabolic Panel (12)  ? Liver cyst      ? Antibiotic-induced yeast infection      ?  Relevant Medications  ? fluconazole (DIFLUCAN) 150 MG tablet  ? BMI 20.0-20.9, adult      ? ?  ? ? ?Outpatient Encounter Medications as of 11/01/2021  ?Medication Sig  ? cephALEXin (KEFLEX) 500 MG capsule Take 1 capsule (500 mg total) by mouth 4 (four) times daily for 7 days.  ? fluconazole (DIFLUCAN) 150 MG tablet Take 1 tablet (150 mg total) by mouth once for 1 dose.  ? GLUCOSAMINE-CHONDROITIN PO Take 2 tablets by mouth every morning.  ? Multiple Vitamins-Minerals (ICAPS AREDS 2 PO) Take 1 capsule by mouth 2 (two) times daily.  ? Omega-3 Fatty Acids (FISH OIL TRIPLE STRENGTH) 1400 MG CAPS Take 1,400 mg by mouth every morning.  ? SUMAtriptan-naproxen (TREXIMET) 85-500 MG tablet Take 1 tablet by mouth daily as needed for migraine.  ? ?No facility-administered encounter medications on file as of 11/01/2021.  ? ?.1. History of sepsis ?Patient symptoms resolved, encourage patient to complete course of antibiotics as previously prescribed. ? ?Patient given appointment to establish care at [facility].  Red flags given for prompt reevaluation ? ?2. Antibiotic-induced yeast infection ?Trial Diflucan sent prescription due to holiday weekend.  Patient understands not to use medication unless needed. ?- fluconazole (DIFLUCAN) 150 MG tablet; Take 1 tablet (150 mg total) by mouth once for 1 dose.  Dispense: 1 tablet; Refill: 0 ? ?3. Aneurysm, splenic artery (HCC) ?Continue to monitor, keep follow-up with primary care provider ? ?4. Thrombocytopenia ([place]) ? ?- CBC with  Differential/Platelet ? ?5. Normocytic anemia ? ? ?6. Neck pain ? ? ?7. Leukocytosis, unspecified type ? ? ?8. Hypocalcemia ? ?- Comp. Metabolic Panel (12) ? ?9. Liver cyst ? ? ?10. BMI 20.0-20.9, adult ? ? ? ?I have reviewed the patie

## 2021-11-01 NOTE — Patient Instructions (Signed)
I sent a prescription for Diflucan to your pharmacy in case she started having symptoms of a yeast infection. ? ?You with today's lab results. ? ?Please let us know if there is anything else we can do for you ? ?Kennieth Rad, PA-C ?Physician Assistant ?[place] ?[email or web address] ? ? ?Health Maintenance, Female ?Adopting a healthy lifestyle and getting preventive care are important in promoting health and wellness. Ask your health care provider about: ?The right schedule for you to have regular tests and exams. ?Things you can do on your own to prevent diseases and keep yourself healthy. ?What should I know about diet, weight, and exercise? ?Eat a healthy diet ? ?Eat a diet that includes plenty of vegetables, fruits, low-fat dairy products, and lean protein. ?Do not eat a lot of foods that are high in solid fats, added sugars, or sodium. ?Maintain a healthy weight ?Body mass index (BMI) is used to identify weight problems. It estimates body fat based on height and weight. Your health care provider can help determine your BMI and help you achieve or maintain a healthy weight. ?Get regular exercise ?Get regular exercise. This is one of the most important things you can do for your health. Most adults should: ?Exercise for at least 150 minutes each week. The exercise should increase your heart rate and make you sweat (moderate-intensity exercise). ?Do strengthening exercises at least twice a week. This is in addition to the moderate-intensity exercise. ?Spend less time sitting. Even light physical activity can be beneficial. ?Watch cholesterol and blood lipids ?Have your blood tested for lipids and cholesterol at 70 years of age, then have this test every 5 years. ?Have your cholesterol levels checked more often if: ?Your lipid or cholesterol levels are high. ?You are older than 70 years of age. ?You are at high risk for heart disease. ?What should I know  about cancer screening? ?Depending on your health history and family history, you may need to have cancer screening at various ages. This may include screening for: ?Breast cancer. ?Cervical cancer. ?Colorectal cancer. ?Skin cancer. ?Lung cancer. ?What should I know about heart disease, diabetes, and high blood pressure? ?Blood pressure and heart disease ?High blood pressure causes heart disease and increases the risk of stroke. This is more likely to develop in people who have high blood pressure readings or are overweight. ?Have your blood pressure checked: ?Every 3-5 years if you are 78-60 years of age. ?Every year if you are 59 years old or older. ?Diabetes ?Have regular diabetes screenings. This checks your fasting blood sugar level. Have the screening done: ?Once every three years after age 53 if you are at a normal weight and have a low risk for diabetes. ?More often and at a younger age if you are overweight or have a high risk for diabetes. ?What should I know about preventing infection? ?Hepatitis B ?If you have a higher risk for hepatitis B, you should be screened for this virus. Talk with your health care provider to find out if you are at risk for hepatitis B infection. ?Hepatitis C ?Testing is recommended for: ?Everyone born from [birth year] through 1964. ?Anyone with known risk factors for hepatitis C. ?Sexually transmitted infections (STIs) ?Get screened for STIs, including gonorrhea and chlamydia, if: ?You are sexually active and are younger than 70 years of age. ?You are older than 70 years of age and your health care provider tells you that you are at risk for this type of infection. ?Your sexual activity  has changed since you were last screened, and you are at increased risk for chlamydia or gonorrhea. Ask your health care provider if you are at risk. ?Ask your health care provider about whether you are at high risk for HIV. Your health care provider may recommend a prescription medicine to help prevent  HIV infection. If you choose to take medicine to prevent HIV, you should first get tested for HIV. You should then be tested every 3 months for as long as you are taking the medicine. ?Pregnancy ?If you are about to stop having your period (premenopausal) and you may become pregnant, seek counseling before you get pregnant. ?Take 400 to 800 micrograms (mcg) of folic acid every day if you become pregnant. ?Ask for birth control (contraception) if you want to prevent pregnancy. ?Osteoporosis and menopause ?Osteoporosis is a disease in which the bones lose minerals and strength with aging. This can result in bone fractures. If you are 21 years old or older, or if you are at risk for osteoporosis and fractures, ask your health care provider if you should: ?Be screened for bone loss. ?Take a calcium or vitamin D supplement to lower your risk of fractures. ?Be given hormone replacement therapy (HRT) to treat symptoms of menopause. ?Follow these instructions at home: ?Alcohol use ?Do not drink alcohol if: ?Your health care provider tells you not to drink. ?You are pregnant, may be pregnant, or are planning to become pregnant. ?If you drink alcohol: ?Limit how much you have to: ?0-1 drink a day. ?Know how much alcohol is in your drink. In the [place] one drink equals one 12 oz bottle of beer (355 mL), one 5 oz glass of wine (148 mL), or one 1? oz glass of hard liquor (44 mL). ?Lifestyle ?Do not use any products that contain nicotine or tobacco. These products include cigarettes, chewing tobacco, and vaping devices, such as e-cigarettes. If you need help quitting, ask your health care provider. ?Do not use street drugs. ?Do not share needles. ?Ask your health care provider for help if you need support or information about quitting drugs. ?General instructions ?Schedule regular health, dental, and eye exams. ?Stay current with your vaccines. ?Tell your health care provider if: ?You often feel depressed. ?You have ever been  abused or do not feel safe at home. ?Summary ?Adopting a healthy lifestyle and getting preventive care are important in promoting health and wellness. ?Follow your health care provider's instructions about healthy diet, exercising, and getting tested or screened for diseases. ?Follow your health care provider's instructions on monitoring your cholesterol and blood pressure. ?This information is not intended to replace advice given to you by your health care provider. Make sure you discuss any questions you have with your health care provider. ?Document Revised: 12/04/2020 Document Reviewed: 12/04/2020 ?Elsevier Patient Education ? [place]. ? ?

## 2021-11-01 NOTE — Progress Notes (Signed)
Patient has eaten and taken medication today ?Patient reports 'banging" her shin on the car door this morning, the same leg she had cellulitis in. ? ?

## 2021-11-02 LAB — CBC WITH DIFFERENTIAL/PLATELET
Basophils Absolute: 0.1 10*3/uL (ref 0.0–0.2)
Basos: 2 %
EOS (ABSOLUTE): 0.2 10*3/uL (ref 0.0–0.4)
Eos: 4 %
Hematocrit: 40.7 % (ref 34.0–46.6)
Hemoglobin: 13.8 g/dL (ref 11.1–15.9)
Immature Grans (Abs): 0.1 10*3/uL (ref 0.0–0.1)
Immature Granulocytes: 1 %
Lymphocytes Absolute: 1.5 10*3/uL (ref 0.7–3.1)
Lymphs: 28 %
MCH: 29.4 pg (ref 26.6–33.0)
MCHC: 33.9 g/dL (ref 31.5–35.7)
MCV: 87 fL (ref 79–97)
Monocytes Absolute: 0.7 10*3/uL (ref 0.1–0.9)
Monocytes: 13 %
Neutrophils Absolute: 2.7 10*3/uL (ref 1.4–7.0)
Neutrophils: 52 %
Platelets: 317 10*3/uL (ref 150–450)
RBC: 4.69 x10E6/uL (ref 3.77–5.28)
RDW: 13.2 % (ref 11.7–15.4)
WBC: 5.1 10*3/uL (ref 3.4–10.8)

## 2021-11-02 LAB — COMP. METABOLIC PANEL (12)
AST: 25 IU/L (ref 0–40)
Albumin/Globulin Ratio: 1.6 (ref 1.2–2.2)
Albumin: 4.1 g/dL (ref 3.8–4.8)
Alkaline Phosphatase: 87 IU/L (ref 44–121)
BUN/Creatinine Ratio: 20 (ref 12–28)
BUN: 16 mg/dL (ref 8–27)
Bilirubin Total: 0.6 mg/dL (ref 0.0–1.2)
Calcium: 9.1 mg/dL (ref 8.7–10.3)
Chloride: 103 mmol/L (ref 96–106)
Creatinine, Ser: 0.81 mg/dL (ref 0.57–1.00)
Globulin, Total: 2.6 g/dL (ref 1.5–4.5)
Glucose: 104 mg/dL — ABNORMAL HIGH (ref 70–99)
Potassium: 4.8 mmol/L (ref 3.5–5.2)
Sodium: 141 mmol/L (ref 134–144)
Total Protein: 6.7 g/dL (ref 6.0–8.5)
eGFR: 78 mL/min/{1.73_m2} (ref >59)

## 2021-11-05 ENCOUNTER — Telehealth: Payer: Self-pay | Admitting: *Deleted

## 2021-11-05 NOTE — Telephone Encounter (Signed)
-----   Message from Kennieth Rad, PA-C sent at 11/02/2021  7:37 AM EDT ----- ?Please call patient and let her know that her calcium levels have returned to within normal limits, she does not show signs of anemia and her platelets have also returned to normal limits. ?

## 2021-11-05 NOTE — Telephone Encounter (Signed)
Patient verified DOB ?Patient is aware of labs and will continue to follow up as needed. ?

## 2021-11-12 ENCOUNTER — Encounter: Payer: Self-pay | Admitting: Family Medicine

## 2021-11-12 ENCOUNTER — Ambulatory Visit: Payer: Medicare Other | Admitting: Family Medicine

## 2021-11-12 ENCOUNTER — Ambulatory Visit: Payer: Self-pay

## 2021-11-12 ENCOUNTER — Other Ambulatory Visit: Payer: Medicare Other

## 2021-11-12 VITALS — BP 129/79 | HR 76 | Temp 97.5°F | Resp 16 | Wt 124.4 lb

## 2021-11-12 DIAGNOSIS — M25572 Pain in left ankle and joints of left foot: Secondary | ICD-10-CM | POA: Diagnosis not present

## 2021-11-12 DIAGNOSIS — M25472 Effusion, left ankle: Secondary | ICD-10-CM | POA: Diagnosis not present

## 2021-11-12 NOTE — Telephone Encounter (Signed)
?  Chief Complaint: Swelling on ankle /redness ?Symptoms: pain swelling ?Frequency: weeks ?Pertinent Negatives: Patient denies fever ?Disposition: '[]'$ ED /'[]'$ Urgent Care (no appt availability in office) / '[x]'$ Appointment(In office/virtual)/ '[]'$  [facility]/ '[]'$ Home Care/ '[]'$ Refused Recommended Disposition /'[]'$ [facility]/ '[]'$  Follow-up with PCP ?Additional Notes: Pt was is the hospital for 4 days for this. Condition has not resolved and is worsening. Ankle is swollen and painful 8/10. Pt recalls removing a possible bug part from her ankle at the beginning of this problem. Pt is wondering if this might be a tick bourne illness, and would like further testing. ? ?Reason for Disposition ? [1] Very swollen ankle AND [2] no fever ? ?Answer Assessment - Initial Assessment Questions ?1. LOCATION: "Which ankle is swollen?" "Where is the swelling?" ?    left ?2. ONSET: "When did the swelling start?" ?    *No Answer* ?3. SIZE: "How large is the swelling?" ?    1.5 inches by 1 inch ?4. PAIN: "Is there any pain?" If Yes, ask: "How bad is it?" (Scale 1-10; or mild, moderate, severe) ?  - NONE (0): no pain. ?  - MILD (1-3): doesn't interfere with normal activities.  ?  - MODERATE (4-7): interferes with normal activities (e.g., work or school) or awakens from sleep, limping.  ?  - SEVERE (8-10): excruciating pain, unable to do any normal activities, unable to walk.  ?    8/10 ?5. CAUSE: "What do you think caused the ankle swelling?" ?    Unknown ?6. OTHER SYMPTOMS: "Do you have any other symptoms?" (e.g., fever, chest pain, difficulty breathing, calf pain) ?    *No Answer* ?7. PREGNANCY: "Is there any chance you are pregnant?" "When was your last menstrual period?" ?    *No Answer* ? ?Protocols used: Ankle Swelling-A-AH ? ?

## 2021-11-12 NOTE — Progress Notes (Signed)
? ?Established Patient Office Visit ? ?Subjective:  ?Patient ID: Christy Cruz, female    DOB: 1950-09-29  Age: 71 y.o. MRN: [id number] ? ?CC: No chief complaint on file. ? ? ?HPI ?Christy Cruz presents for pain and swelling in her left ankle. Patient was hospitalized recently with sepsis and sx have been decreasing. She continue to have minimal swelling and moderate tenderness of her left ankle and is finding it difficult to ambulated 2/2 symptoms. She denies fever.chills or other septic sx.  ? ?No past medical history on file. ? ?Past Surgical History:  ?Procedure Laterality Date  ? OOPHORECTOMY    ? TONSILLECTOMY    ? ? ?No family history on file. ? ?Social History  ? ?Socioeconomic History  ? Marital status: Married  ?  Spouse name: Not on file  ? Number of children: Not on file  ? Years of education: Not on file  ? Highest education level: Not on file  ?Occupational History  ? Not on file  ?Tobacco Use  ? Smoking status: Never  ? Smokeless tobacco: Never  ?Substance and Sexual Activity  ? Alcohol use: Never  ? Drug use: Never  ? Sexual activity: Yes  ?Other Topics Concern  ? Not on file  ?Social History Narrative  ? Not on file  ? ?Social Determinants of Health  ? ?Financial Resource Strain: Not on file  ?Food Insecurity: Not on file  ?Transportation Needs: Not on file  ?Physical Activity: Not on file  ?Stress: Not on file  ?Social Connections: Not on file  ?Intimate Partner Violence: Not on file  ? ? ?ROS ?Review of Systems  ?Respiratory: Negative.    ?Cardiovascular:  Positive for leg swelling.  ?All other systems reviewed and are negative. ? ?Objective:  ? ?Today's Vitals: BP 129/79   Pulse 76   Temp (!) 97.5 ?F (36.4 ?C) (Oral)   Resp 16   Wt 124 lb 6.4 oz (56.4 kg)   SpO2 98%   BMI 20.70 kg/m?  ? ?Physical Exam ?Vitals and nursing note reviewed.  ?Constitutional:   ?   General: She is not in acute distress. ?Cardiovascular:  ?   Rate and Rhythm: Normal rate and regular rhythm.  ?Pulmonary:  ?    Effort: Pulmonary effort is normal.  ?   Breath sounds: Normal breath sounds.  ?Musculoskeletal:  ?   Left ankle: Swelling present. No deformity. Tenderness present. Decreased range of motion.  ?Neurological:  ?   General: No focal deficit present.  ?   Mental Status: She is alert and oriented to person, place, and time.  ? ? ?Assessment & Plan:  ? ?1. Pain of joint of left ankle and foot ?? Sx of vasculitis or vascular etiology. Will refer for further eval/mgt ?- Ambulatory referral to Vascular Surgery ? ?2. Left ankle swelling ?As above ?- Ambulatory referral to Vascular Surgery ? ?Outpatient Encounter Medications as of 11/12/2021  ?Medication Sig  ? GLUCOSAMINE-CHONDROITIN PO Take 2 tablets by mouth every morning.  ? Multiple Vitamins-Minerals (ICAPS AREDS 2 PO) Take 1 capsule by mouth 2 (two) times daily.  ? Omega-3 Fatty Acids (FISH OIL TRIPLE STRENGTH) 1400 MG CAPS Take 1,400 mg by mouth every morning.  ? penicillin v potassium (VEETID) 500 MG tablet penicillin V potassium 500 mg tablet ? TAKE 1 TABLET BY MOUTH TWICE A DAY FOR 10 DAYS  ? SUMAtriptan-naproxen (TREXIMET) 85-500 MG tablet Take 1 tablet by mouth daily as needed for migraine.  ? tretinoin (RETIN-A) 0.025 % cream  1 application in the evening to face  ? ?No facility-administered encounter medications on file as of 11/12/2021.  ? ? ?Follow-up: No follow-ups on file.  ? ?Becky Sax, MD ? ?

## 2021-11-12 NOTE — Progress Notes (Signed)
Patient is her with having continual swelling to here left leg . Patient is having pain , bumps, and redness even after her treatment x 1wk ago. ?

## 2021-11-13 ENCOUNTER — Ambulatory Visit: Payer: Medicare Other | Admitting: Physician Assistant

## 2021-11-13 ENCOUNTER — Other Ambulatory Visit: Payer: Self-pay

## 2021-11-13 DIAGNOSIS — M7989 Other specified soft tissue disorders: Secondary | ICD-10-CM

## 2021-11-14 ENCOUNTER — Ambulatory Visit (HOSPITAL_COMMUNITY)
Admission: RE | Admit: 2021-11-14 | Discharge: 2021-11-14 | Disposition: A | Discharge status: Home / Self Care | Payer: Medicare Other | Source: Ambulatory Visit | Attending: Vascular Surgery | Admitting: Vascular Surgery

## 2021-11-14 ENCOUNTER — Ambulatory Visit: Payer: Medicare Other | Admitting: Vascular Surgery

## 2021-11-14 ENCOUNTER — Encounter (HOSPITAL_COMMUNITY): Payer: Self-pay

## 2021-11-14 ENCOUNTER — Encounter: Payer: Self-pay | Admitting: Vascular Surgery

## 2021-11-14 VITALS — BP 126/77 | HR 78 | Temp 98.1°F | Resp 14 | Ht 65.0 in | Wt 121.0 lb

## 2021-11-14 DIAGNOSIS — M79662 Pain in left lower leg: Secondary | ICD-10-CM | POA: Diagnosis not present

## 2021-11-14 DIAGNOSIS — M7989 Other specified soft tissue disorders: Secondary | ICD-10-CM | POA: Diagnosis not present

## 2021-11-14 NOTE — Progress Notes (Signed)
? ?ASSESSMENT & PLAN  ? ?CELLULITIS AND SWELLING LEFT LEG: Certainly it appears that she was bitten and that this is the most likely etiology for her cellulitis and swelling in the left leg with sepsis.  She completed antibiotics which she finished on 11/01/2021.  The swelling has improved but she still having pain.  Given that the etiology of her cellulitis is not clear I think it would be helpful to get infectious disease input.  We will try to make that arrangement as currently the patient does not have a primary care physician.  We have also given her a list for primary care physicians.  We have discussed the importance of intermittent leg elevation and the proper positioning for this. ? 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  Given her age I think this is something we simply need to follow.  As she is not of childbearing age this should not be a problem.  I have ordered a follow-up CT in 1 year and I will see her back at that time.  If it remains stable we can slowly stretch her follow-up out. ? ?REASON FOR CONSULT:   ? ?Pain and swelling of the left ankle.  The consult is requested by Dr. Dorna Mai.   ? ?HPI:  ? ?Christy Cruz is a 71 y.o. female who was admitted to the hospital on 10/22/2021 with left lower extremity pain fevers and chills.  She was found to be septic with leukocytosis and elevated lactate.  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  She had a venous duplex scan during that admission which showed no evidence of DVT in the left lower extremity.  The etiology of the cellulitis was never clear.  She does not remember any injury to her left leg.  She does not remember getting bitten.  She does not remember being out in the woods and being exposed to ticks anymore than usual. ? ?She denies any history of claudication, rest pain, or nonhealing ulcers. ? ?She has  no real risk factors for peripheral arterial disease.  She denies any history of diabetes, hypertension, hypercholesterolemia, family history of premature cardiovascular disease, or tobacco use. ? ?She has no previous history of DVT or phlebitis. ? ?History reviewed. No pertinent past medical history. ? ?History reviewed. No pertinent family history. ? ?SOCIAL HISTORY: ?Social History  ? ?Tobacco Use  ? Smoking status: Never  ? Smokeless tobacco: Never  ?Substance Use Topics  ? Alcohol use: Never  ? ? ?No Known Allergies ? ?Current Outpatient Medications  ?Medication Sig Dispense Refill  ? GLUCOSAMINE-CHONDROITIN PO Take 2 tablets by mouth every morning.    ? Omega-3 Fatty Acids (FISH OIL TRIPLE STRENGTH) 1400 MG CAPS Take 1,400 mg by mouth every morning.    ? SUMAtriptan-naproxen (TREXIMET) 85-500 MG tablet Take 1 tablet by mouth daily as needed for migraine.    ? Multiple Vitamins-Minerals (ICAPS AREDS 2 PO) Take 1 capsule by mouth 2 (two) times daily. (Patient not taking: Reported on 11/14/2021)    ? penicillin v potassium (VEETID) 500 MG tablet penicillin V potassium 500 mg tablet ? TAKE 1 TABLET BY MOUTH TWICE A DAY FOR 10 DAYS (Patient not taking: Reported on 11/14/2021)    ? tretinoin (RETIN-A) 0.962 % cream 1 application in the evening to face (Patient not taking: Reported on 11/14/2021)    ? ?  No current facility-administered medications for this visit.  ? ? ?REVIEW OF SYSTEMS:  ?'[X]'$  denotes positive finding, '[ ]'$  denotes negative finding ?Cardiac  Comments:  ?Chest pain or chest pressure:    ?Shortness of breath upon exertion:    ?Short of breath when lying flat:    ?Irregular heart rhythm:    ?    ?Vascular    ?Pain in calf, thigh, or hip brought on by ambulation:    ?Pain in feet at night that wakes you up from your sleep:     ?Blood clot in your veins:    ?Leg swelling:  x Left ankle  ?    ?Pulmonary    ?Oxygen at home:    ?Productive cough:     ?Wheezing:     ?    ?Neurologic    ?Sudden weakness in arms or  legs:     ?Sudden numbness in arms or legs:     ?Sudden onset of difficulty speaking or slurred speech:    ?Temporary loss of vision in one eye:     ?Problems with dizziness:     ?    ?Gastrointestinal    ?Blood in stool:     ?Vomited blood:     ?    ?Genitourinary    ?Burning when urinating:     ?Blood in urine:    ?    ?Psychiatric    ?Major depression:     ?    ?Hematologic    ?Bleeding problems:    ?Problems with blood clotting too easily:    ?    ?Skin    ?Rashes or ulcers: x Left lower leg  ?    ?Constitutional    ?Fever or chills:    ?- ? ?PHYSICAL EXAM:  ? ?Vitals:  ? 11/14/21 1057  ?BP: 126/77  ?Pulse: 78  ?Resp: 14  ?Temp: 98.1 ?F (36.7 ?C)  ?TempSrc: Temporal  ?Weight: 121 lb (54.9 kg)  ?Height: '5\' 5"'$  (1.651 m)  ? ?Body mass index is 20.14 kg/m?. ?GENERAL: The patient is a well-nourished female, in no acute distress. The vital signs are documented above. ?CARDIAC: There is a regular rate and rhythm.  ?VASCULAR: I do not detect carotid bruits. ?She has palpable femoral, popliteal, and dorsalis pedis pulses bilaterally.  She has a biphasic posterior tibial signal bilaterally. ?She has some left lower extremity swelling around the ankle and cellulitis. ?PULMONARY: There is good air exchange bilaterally without wheezing or rales. ?ABDOMEN: Soft and non-tender with normal pitched bowel sounds.  I do not palpate an aneurysm. ?MUSCULOSKELETAL: There are no major deformities. ?NEUROLOGIC: No focal weakness or paresthesias are detected. ?SKIN: There are no ulcers or rashes noted. ?PSYCHIATRIC: The patient has a normal affect. ? ?DATA:   ? ?VENOUS DUPLEX: I have reviewed the venous duplex scan that was done on 10/23/2021.  This showed no evidence of DVT in the left lower extremity.  There was no evidence of superficial deep venous thrombosis.  There was no evidence of a Baker's cyst.  There were pulsatile Doppler waveforms which could be consistent with elevated right heart pressure. ? ?CT ABDOMEN: I have reviewed  the CT of her abdomen which shows a 1.3 cm calcified splenic artery aneurysm.  Otherwise the CT is fairly unremarkable. ? ?Deitra Mayo ?[facility] ?

## 2021-11-15 ENCOUNTER — Ambulatory Visit
Admission: EM | Admit: 2021-11-15 | Discharge: 2021-11-15 | Disposition: A | Discharge status: Home / Self Care | Payer: Medicare Other | Attending: Urgent Care | Admitting: Urgent Care

## 2021-11-15 ENCOUNTER — Ambulatory Visit (INDEPENDENT_AMBULATORY_CARE_PROVIDER_SITE_OTHER): Payer: Medicare Other

## 2021-11-15 ENCOUNTER — Inpatient Hospital Stay: Payer: Medicare Other | Admitting: Family

## 2021-11-15 DIAGNOSIS — L03116 Cellulitis of left lower limb: Secondary | ICD-10-CM

## 2021-11-15 DIAGNOSIS — M25572 Pain in left ankle and joints of left foot: Secondary | ICD-10-CM | POA: Diagnosis not present

## 2021-11-15 DIAGNOSIS — M25472 Effusion, left ankle: Secondary | ICD-10-CM | POA: Diagnosis not present

## 2021-11-15 DIAGNOSIS — D692 Other nonthrombocytopenic purpura: Secondary | ICD-10-CM

## 2021-11-15 NOTE — ED Notes (Signed)
Patient not seen by this nurse.   ? ?Patient seen and discharged by other ?

## 2021-11-15 NOTE — Discharge Instructions (Addendum)
Your x-ray does not show signs of osteomyelitis. ?I am concerned about a vasculitis, possibly immune mediated. ?Please keep your appointment with infectious disease tomorrow, consider a rheumatological work-up if ID unable to provide answers.Marland Kitchen ?I would recommend labs be obtained for further evaluation. ? ?

## 2021-11-15 NOTE — ED Provider Notes (Signed)
?[facility] ? ? ? ?CSN: [id number] ?Arrival date & time: 11/15/21  6226 ? ? ?  ? ?History   ?Chief Complaint ?Chief Complaint  ?Patient presents with  ? cellulitis  ?  L Ankle   ? ? ?HPI ?Christy Cruz is a 71 y.o. female.  ? ?Pleasant 71 year old female presents with concerns of left ankle pain.  She was admitted to the hospital the end of March with a diagnosis of sepsis secondary to cellulitis of her left lower extremity.  She was in the hospital for 4 days receiving vancomycin and Rocephin.  She was then discharged home on a 1 week dose of Keflex.  She also took 1 week of doxycycline.  While in the hospital, she developed significant petechiae all over her lower extremity, followed by 2 rather large areas of purpura to her bilateral ankle.  She was told upon discharge that she should be back to the gym in less than a week, and all symptoms would be fully resolved after taking her antibiotics.  However, since discharge, she continues to have primarily lateral ankle pain.  Her edema is inconsistent.  Her infection is gone, but she states she wakes up in very severe pain in the morning to the point where it is hard to walk.  It hurts to touch the lateral malleolus and she is having pain posterior to that as well.  She saw her new PCP who referred her to vascular.  She saw vascular physician yesterday who feels her symptoms are related to a bite.  He ruled out venous and arterial disease.  She was instructed to properly elevate legs with the extremity above the level of the heart, and knees bent, but she states that she tried this several times yesterday and the pain increased with doing this.  [facility] spotted fever testing had been performed when in the hospital, but no additional work-up has been performed to date.  Patient is here primarily requesting an ankle x-ray to rule out osteomyelitis of the lateral ankle.  She is a very active individual and has a trip planned to [place] next month, and  is hoping to have her symptoms resolved so she can fully ambulate.  She does have an appointment scheduled with infectious disease tomorrow. ? ? ? ?History reviewed. No pertinent past medical history. ? ?Patient Active Problem List  ? Diagnosis Date Noted  ? History of sepsis 11/01/2021  ? Hypocalcemia 11/01/2021  ? 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 Neck pain 10/25/2021  ? Thrombocytopenia ([place]) 10/23/2021  ? Normocytic anemia 10/23/2021  ? Leukocytosis 10/23/2021  ? Lactic acidosis 10/23/2021  ? Cellulitis 10/22/2021  ? Aneurysm, splenic artery ([place]) 10/22/2021  ? Severe sepsis ([place]) 10/22/2021  ? Vaginal discharge 10/22/2021  ? ? ?Past Surgical History:  ?Procedure Laterality Date  ? OOPHORECTOMY    ? TONSILLECTOMY    ? ? ?OB History   ?No obstetric history on file. ?  ? ? ? ?Home Medications   ? ?Prior to Admission medications   ?Medication Sig Start Date End Date Taking? Authorizing Provider  ?GLUCOSAMINE-CHONDROITIN PO Take 2 tablets by mouth every morning.    [provider]  ?Multiple Vitamins-Minerals (ICAPS AREDS 2 PO) Take 1 capsule by mouth 2 (two) times daily. ?Patient not taking: Reported on 11/14/2021    [provider]  ?Omega-3 Fatty Acids (FISH OIL TRIPLE STRENGTH) 1400 MG CAPS Take 1,400 mg by mouth every morning.    [provider]  ?SUMAtriptan-naproxen (TREXIMET) [id number]  MG tablet Take 1 tablet by mouth daily as needed for migraine.    [provider]  ? ? ?Family History ?History reviewed. No pertinent family history. ? ?Social History ?Social History  ? ?Tobacco Use  ? Smoking status: Never  ? Smokeless tobacco: Never  ?Substance Use Topics  ? Alcohol use: Never  ? Drug use: Never  ? ? ? ?Allergies   ?Patient has no known allergies. ? ? ?Review of Systems ?Review of Systems  ?Musculoskeletal:  Positive for arthralgias (L lateral ankle) and joint swelling (L ankle).  ?Hematological:  Bruises/bleeds easily (recurrent purpura to lateral L ankle).  ? ? ?Physical  Exam ?Triage Vital Signs ?ED Triage Vitals [11/15/21 0932]  ?Enc Vitals Group  ?   BP 125/79  ?   Pulse Rate 69  ?   Resp 17  ?   Temp 98.1 ?F (36.7 ?C)  ?   Temp Source Oral  ?   SpO2 98 %  ?   Weight   ?   Height   ?   Head Circumference   ?   Peak Flow   ?   Pain Score 0  ?   Pain Loc   ?   Pain Edu?   ?   Excl. in [place]?   ? ?No data found. ? ?Updated Vital Signs ?BP 125/79 (BP Location: Left Arm)   Pulse 69   Temp 98.1 ?F (36.7 ?C) (Oral)   Resp 17   SpO2 98%  ? ?Visual Acuity ?Right Eye Distance:   ?Left Eye Distance:   ?Bilateral Distance:   ? ?Right Eye Near:   ?Left Eye Near:    ?Bilateral Near:    ? ?Physical Exam ?Vitals and nursing note reviewed.  ?Constitutional:   ?   General: She is not in acute distress. ?   Appearance: Normal appearance. She is normal weight. She is not ill-appearing, toxic-appearing or diaphoretic.  ?HENT:  ?   Head: Normocephalic and atraumatic.  ?Cardiovascular:  ?   Rate and Rhythm: Normal rate.  ?   Pulses: Normal pulses.  ?Pulmonary:  ?   Effort: Pulmonary effort is normal.  ?Musculoskeletal:     ?   General: Swelling (lateral ankle) and tenderness (significant pain to palpation of lateral malleolus and soft tissue posterior to this) present. No deformity.  ?   Right lower leg: No edema.  ?   Left lower leg: Edema (moderate pitting edema to LLE, primarily dorsal foot and lateral ankle. Mild purpura laterally) present.  ?Skin: ?   General: Skin is warm.  ?   Capillary Refill: Capillary refill takes less than 2 seconds.  ?   Coloration: Skin is not jaundiced.  ?   Findings: No erythema.  ?   Comments: Significant purple discoloration primarily to lateral aspect of ankle and pooling in the distal foot. ?Dry skin to LLE distal leg, slight brawny appearance  ?Neurological:  ?   General: No focal deficit present.  ?   Mental Status: She is alert and oriented to person, place, and time.  ?   Sensory: No sensory deficit.  ?   Motor: No weakness.  ? ? ? ?UC Treatments / Results   ?Labs ?(all labs ordered are listed, but only abnormal results are displayed) ?Labs Reviewed - No data to display ? ?EKG ? ? ?Radiology ?DG Ankle Complete Left ? ?Result Date: 11/15/2021 ?CLINICAL DATA:  Severe pain and swelling. Cellulitis on left ankle. Nonhealing. EXAM: LEFT ANKLE COMPLETE -  3+ VIEW COMPARISON:  None. FINDINGS: Mild diffuse ankle soft tissue swelling. The ankle mortise is symmetric and intact. Joint spaces are preserved. No acute fracture is seen. No dislocation. Minimal enthesopathic change at the Achilles insertion on the calcaneus. No cortical erosion is seen.  No subcutaneous air. IMPRESSION:: IMPRESSION: 1. Mild ankle soft tissue swelling. 2. No cortical erosion to indicate acute osteomyelitis. Electronically Signed   By: Yvonne Kendall M.D.   On: 11/15/2021 09:47   ? ?Procedures ?Procedures (including critical care time) ? ?Medications Ordered in UC ?Medications - No data to display ? ?Initial Impression / Assessment and Plan / UC Course  ?I have reviewed the triage vital signs and the nursing notes. ? ?Pertinent labs & imaging results that were available during my care of the patient were reviewed by me and considered in my medical decision making (see chart for details). ? ?  ? ?Edema L ankle -x-ray was unremarkable for acute bony injury.  She has a follow-up with infectious disease tomorrow.  The edema does not respond in fact worsens with foot elevation.  She is not interested in trying Lasix or other diuretics.  Compression hose are far too painful to get on. ?Purpura -location of the purpura is the site of the max pain.  Possible concern for vasculitis.  Discussed possible referral to rheumatology should infectious disease not provide any further answers.  Discussed possible trial of prednisone, will await specialty evaluation.  Consider obtaining ESR, ANA, repeat CBC ? ?Final Clinical Impressions(s) / UC Diagnoses  ? ?Final diagnoses:  ?Edema of left ankle  ?Purpura ([place])   ? ? ? ?Discharge Instructions   ? ?  ?Your x-ray does not show signs of osteomyelitis. ?I am concerned about a vasculitis, possibly immune mediated. ?Please keep your appointment with infectious disease tomorrow, consider a rheumato

## 2021-11-15 NOTE — ED Triage Notes (Signed)
Last seen at the ED for cellulitis on 3/27 tx with antibiotics. Pt reports that she felt like her sxs were improving initially, but that she continues to have LLE swelling and a knot around her left ankle. Notes some skin peeling. Sxs are worse in the morning, noting difficulty getting out of bed this morning. ?

## 2021-11-16 ENCOUNTER — Other Ambulatory Visit: Payer: Self-pay

## 2021-11-16 ENCOUNTER — Ambulatory Visit: Payer: Medicare Other | Admitting: Internal Medicine

## 2021-11-16 ENCOUNTER — Encounter: Payer: Self-pay | Admitting: Internal Medicine

## 2021-11-16 DIAGNOSIS — M25472 Effusion, left ankle: Secondary | ICD-10-CM | POA: Diagnosis not present

## 2021-11-16 DIAGNOSIS — M02372 Reiter's disease, left ankle and foot: Secondary | ICD-10-CM | POA: Insufficient documentation

## 2021-11-16 NOTE — Progress Notes (Signed)
?  [facility]  ? ? ? ? ?Reason for Consult: cellulitis  ?Referring Physician: Dr. Scot Dock ? ? ? Patient ID: Christy Cruz, female    DOB: 18-Apr-1951, 71 y.o.   MRN: [id number] ? ?HPI:   ?She is here for evaluation of a previous history of cellulitis.   ?She was hospitalized in last month with concern for sepsis and cellulitis of the left leg.  No history of it before and after treatment of her cellulitis, she still has pain in her leg with some edema.  No redness, no warmth.  She went to vascular surgery yesterday and referred her for ? Etiology or if due to a bite.   ?She developed acute onset redness after digging a needle into her leg to dig out a foreign body under her skin.  Following this the next day, she developed acute onset of confluent erythema, significant warmth and swelling with fever and systemic symptoms c/w erysipelas.  She was hospitalized and treated with antibiotics and completed antibiotics about 2 weeks ago.  Since then though she has had continued swelling bilateral in the malleolus area of the left leg.  This is stiff in the am and takes a while to improve and continues to hurt.  No previous issues with gout or rheumatic concerns.   ? ? ?PMH: migraines ? ?Prior to Admission medications   ?Medication Sig Start Date End Date Taking? Authorizing Provider  ?GLUCOSAMINE-CHONDROITIN PO Take 2 tablets by mouth every morning.    [provider]  ?Multiple Vitamins-Minerals (ICAPS AREDS 2 PO) Take 1 capsule by mouth 2 (two) times daily. ?Patient not taking: Reported on 11/14/2021    [provider]  ?Omega-3 Fatty Acids (FISH OIL TRIPLE STRENGTH) 1400 MG CAPS Take 1,400 mg by mouth every morning.    [provider]  ?SUMAtriptan-naproxen (TREXIMET) 85-500 MG tablet Take 1 tablet by mouth daily as needed for migraine.    [provider]  ? ? ?No Known Allergies ? ?Social History  ? ?Tobacco Use  ? Smoking status: Never  ? Smokeless tobacco:  Never  ?Substance Use Topics  ? Alcohol use: Never  ? Drug use: Never  ? ? ?Review of Systems ? Constitutional: negative for fevers and chills ?Gastrointestinal: negative for diarrhea ?All other systems reviewed and are negative   ? ?Constitutional: in no apparent distress There were no vitals filed for this visit. ?EYES: anicteric ?Respiratory: normal respiratory effort ?Musculoskeletal: left ankle swelling bilateral, no warmth, no erythema, no petechiae now ? ? ?Labs: ?Lab Results  ?Component Value Date  ? WBC 5.1 11/01/2021  ? HGB 13.8 11/01/2021  ? HCT 40.7 11/01/2021  ? MCV 87 11/01/2021  ? PLT 317 11/01/2021  ?  ?Lab Results  ?Component Value Date  ? CREATININE 0.81 11/01/2021  ? BUN 16 11/01/2021  ? NA 141 11/01/2021  ? K 4.8 11/01/2021  ? CL 103 11/01/2021  ? CO2 23 10/25/2021  ?  ?Lab Results  ?Component Value Date  ? ALT 15 10/24/2021  ? AST 25 11/01/2021  ? ALKPHOS 87 11/01/2021  ? BILITOT 0.6 11/01/2021  ? INR 1.0 10/22/2021  ?  ? ?Assessment: post infectious ankle edema, possible reactive arthritis.  At this time, no concern for active infection on the skin and xray negative for osteomyelitis and clinically does not look like infection.  I suspect this is reactive and will resolve over time with anti-inflammatories.  She may continue to have intermittent symptoms for the next  several months but hopefully will improve with NSAIDs.   ? ?Plan: ?1)  800 mg ibuprofen twice a day for 5-7 days ?Contact me in 1 week if no improvement or changes and can consider rheumatology referral ?

## 2021-11-16 NOTE — Patient Instructions (Signed)
800 mg ibuprofen twice a day for 1 week ?

## 2021-11-26 ENCOUNTER — Other Ambulatory Visit: Payer: Self-pay | Admitting: Internal Medicine

## 2021-11-26 ENCOUNTER — Encounter: Payer: Self-pay | Admitting: Internal Medicine

## 2021-11-26 DIAGNOSIS — M02372 Reiter's disease, left ankle and foot: Secondary | ICD-10-CM

## 2021-11-28 ENCOUNTER — Telehealth: Payer: Self-pay | Admitting: Emergency Medicine

## 2021-11-28 DIAGNOSIS — I776 Arteritis, unspecified: Secondary | ICD-10-CM

## 2021-11-28 NOTE — Telephone Encounter (Signed)
Call from patient regarding lab draw - RN spoke with provider here today - see labs ordered per provider after chart review. Patient to go to [facility] for lab draw.  ?

## 2021-12-09 LAB — COMPREHENSIVE METABOLIC PANEL
AG Ratio: 1.7 (calc) (ref 1.0–2.5)
ALT: 18 U/L (ref 6–29)
AST: 23 U/L (ref 10–35)
Albumin: 4.6 g/dL (ref 3.6–5.1)
Alkaline phosphatase (APISO): 77 U/L (ref 37–153)
BUN: 13 mg/dL (ref 7–25)
CO2: 29 mmol/L (ref 20–32)
Calcium: 9.2 mg/dL (ref 8.6–10.4)
Chloride: 104 mmol/L (ref 98–110)
Creat: 0.74 mg/dL (ref 0.60–1.00)
Globulin: 2.7 g/dL (calc) (ref 1.9–3.7)
Glucose, Bld: 85 mg/dL (ref 65–139)
Potassium: 4.9 mmol/L (ref 3.5–5.3)
Sodium: 140 mmol/L (ref 135–146)
Total Bilirubin: 1.3 mg/dL — ABNORMAL HIGH (ref 0.2–1.2)
Total Protein: 7.3 g/dL (ref 6.1–8.1)

## 2021-12-09 LAB — ANA SCREEN,IFA,REFLEX TITER/PATTERN,REFLEX MPLX 11 AB CASCADE
14-3-3 eta Protein: <0.2 ng/mL (ref <0.2)
Anti Nuclear Antibody (ANA): NEGATIVE
Cyclic Citrullin Peptide Ab: <16 UNITS
Rheumatoid fact SerPl-aCnc: <14 IU/mL (ref <14)

## 2021-12-09 LAB — CBC WITH DIFFERENTIAL/PLATELET
Absolute Monocytes: 614 cells/uL (ref 200–950)
Basophils Absolute: 38 cells/uL (ref 0–200)
Basophils Relative: 0.6 %
Eosinophils Absolute: 198 cells/uL (ref 15–500)
Eosinophils Relative: 3.1 %
HCT: 40.9 % (ref 35.0–45.0)
Hemoglobin: 13.6 g/dL (ref 11.7–15.5)
Lymphs Abs: 1946 cells/uL (ref 850–3900)
MCH: 29.2 pg (ref 27.0–33.0)
MCHC: 33.3 g/dL (ref 32.0–36.0)
MCV: 88 fL (ref 80.0–100.0)
MPV: 10.5 fL (ref 7.5–12.5)
Monocytes Relative: 9.6 %
Neutro Abs: 3603 cells/uL (ref 1500–7800)
Neutrophils Relative %: 56.3 %
Platelets: 205 10*3/uL (ref 140–400)
RBC: 4.65 10*6/uL (ref 3.80–5.10)
RDW: 13.1 % (ref 11.0–15.0)
Total Lymphocyte: 30.4 %
WBC: 6.4 10*3/uL (ref 3.8–10.8)

## 2021-12-09 LAB — C-REACTIVE PROTEIN: CRP: 2 mg/L (ref <8.0)

## 2021-12-09 LAB — SEDIMENTATION RATE: Sed Rate: 2 mm/h (ref 0–30)

## 2021-12-11 ENCOUNTER — Ambulatory Visit (INDEPENDENT_AMBULATORY_CARE_PROVIDER_SITE_OTHER): Payer: Medicare Other | Admitting: Family Medicine

## 2021-12-11 ENCOUNTER — Encounter: Payer: Self-pay | Admitting: Family Medicine

## 2021-12-11 VITALS — BP 109/74 | HR 81 | Temp 98.1°F | Resp 16 | Ht 65.0 in | Wt 123.2 lb

## 2021-12-11 DIAGNOSIS — Z0000 Encounter for general adult medical examination without abnormal findings: Secondary | ICD-10-CM

## 2021-12-11 DIAGNOSIS — D225 Melanocytic nevi of trunk: Secondary | ICD-10-CM | POA: Insufficient documentation

## 2021-12-11 DIAGNOSIS — G4486 Cervicogenic headache: Secondary | ICD-10-CM | POA: Insufficient documentation

## 2021-12-11 DIAGNOSIS — G43909 Migraine, unspecified, not intractable, without status migrainosus: Secondary | ICD-10-CM | POA: Insufficient documentation

## 2021-12-11 DIAGNOSIS — L918 Other hypertrophic disorders of the skin: Secondary | ICD-10-CM | POA: Insufficient documentation

## 2021-12-11 DIAGNOSIS — Q2549 Other congenital malformations of aorta: Secondary | ICD-10-CM | POA: Insufficient documentation

## 2021-12-11 DIAGNOSIS — D2262 Melanocytic nevi of left upper limb, including shoulder: Secondary | ICD-10-CM | POA: Insufficient documentation

## 2021-12-11 DIAGNOSIS — Z85828 Personal history of other malignant neoplasm of skin: Secondary | ICD-10-CM | POA: Insufficient documentation

## 2021-12-11 DIAGNOSIS — M1990 Unspecified osteoarthritis, unspecified site: Secondary | ICD-10-CM | POA: Insufficient documentation

## 2021-12-11 DIAGNOSIS — J069 Acute upper respiratory infection, unspecified: Secondary | ICD-10-CM | POA: Insufficient documentation

## 2021-12-11 DIAGNOSIS — L814 Other melanin hyperpigmentation: Secondary | ICD-10-CM | POA: Insufficient documentation

## 2021-12-11 DIAGNOSIS — L578 Other skin changes due to chronic exposure to nonionizing radiation: Secondary | ICD-10-CM | POA: Insufficient documentation

## 2021-12-11 DIAGNOSIS — T1490XD Injury, unspecified, subsequent encounter: Secondary | ICD-10-CM | POA: Insufficient documentation

## 2021-12-11 DIAGNOSIS — Z130 Encounter for screening for diseases of the blood and blood-forming organs and certain disorders involving the immune mechanism: Secondary | ICD-10-CM

## 2021-12-11 DIAGNOSIS — Q248 Other specified congenital malformations of heart: Secondary | ICD-10-CM | POA: Insufficient documentation

## 2021-12-11 DIAGNOSIS — M7918 Myalgia, other site: Secondary | ICD-10-CM | POA: Insufficient documentation

## 2021-12-11 DIAGNOSIS — Z13220 Encounter for screening for lipoid disorders: Secondary | ICD-10-CM

## 2021-12-11 DIAGNOSIS — D1801 Hemangioma of skin and subcutaneous tissue: Secondary | ICD-10-CM | POA: Insufficient documentation

## 2021-12-11 DIAGNOSIS — L821 Other seborrheic keratosis: Secondary | ICD-10-CM | POA: Insufficient documentation

## 2021-12-11 DIAGNOSIS — R079 Chest pain, unspecified: Secondary | ICD-10-CM | POA: Insufficient documentation

## 2021-12-11 NOTE — Progress Notes (Signed)
Patient is here for her CPE. Patient said that she has no concerns today. Patient said that she feels pretty good. ?

## 2021-12-11 NOTE — Progress Notes (Signed)
? ?Established Patient Office Visit ? ?Subjective   ? ?Patient ID: Christy Cruz, female    DOB: Jan 31, 1951  Age: 71 y.o. MRN: [id number] ? ?CC:  ?Chief Complaint  ?Patient presents with  ? Annual Exam  ? ? ?HPI ?Christy Cruz presents for routine annual physical. Patient denies acute complaints or concerns.  ? ? ?Outpatient Encounter Medications as of 12/11/2021  ?Medication Sig  ? Multiple Vitamins-Minerals (ICAPS AREDS 2 PO) Take 1 capsule by mouth 2 (two) times daily.  ? SUMAtriptan-naproxen (TREXIMET) 85-500 MG tablet Take 1 tablet by mouth daily as needed for migraine.  ? [DISCONTINUED] GLUCOSAMINE-CHONDROITIN PO Take 2 tablets by mouth every morning.  ? [DISCONTINUED] Omega-3 Fatty Acids (FISH OIL TRIPLE STRENGTH) 1400 MG CAPS Take 1,400 mg by mouth every morning.  ? Glucosamine-Chondroit-Vit C-Mn (GLUCOSAMINE 1500 COMPLEX PO)   ? ?No facility-administered encounter medications on file as of 12/11/2021.  ? ? ?No past medical history on file. ? ?Past Surgical History:  ?Procedure Laterality Date  ? OOPHORECTOMY    ? TONSILLECTOMY    ? ? ?No family history on file. ? ?Social History  ? ?Socioeconomic History  ? Marital status: Married  ?  Spouse name: Not on file  ? Number of children: Not on file  ? Years of education: Not on file  ? Highest education level: Not on file  ?Occupational History  ? Not on file  ?Tobacco Use  ? Smoking status: Never  ? Smokeless tobacco: Never  ?Substance and Sexual Activity  ? Alcohol use: Never  ? Drug use: Never  ? Sexual activity: Yes  ?Other Topics Concern  ? Not on file  ?Social History Narrative  ? Not on file  ? ?Social Determinants of Health  ? ?Financial Resource Strain: Not on file  ?Food Insecurity: Not on file  ?Transportation Needs: Not on file  ?Physical Activity: Not on file  ?Stress: Not on file  ?Social Connections: Not on file  ?Intimate Partner Violence: Not on file  ? ? ?Review of Systems  ?All other systems reviewed and are negative. ? ?  ? ? ?Objective   ? ?BP  109/74   Pulse 81   Temp 98.1 ?F (36.7 ?C) (Oral)   Resp 16   Ht '5\' 5"'$  (1.651 m)   Wt 123 lb 3.2 oz (55.9 kg)   SpO2 97%   BMI 20.50 kg/m?  ? ?Physical Exam ?Vitals and nursing note reviewed.  ?Constitutional:   ?   General: She is not in acute distress. ?HENT:  ?   Head: Normocephalic and atraumatic.  ?   Right Ear: Tympanic membrane, ear canal and external ear normal.  ?   Left Ear: Tympanic membrane, ear canal and external ear normal.  ?   Nose: Nose normal.  ?   Mouth/Throat:  ?   Mouth: Mucous membranes are moist.  ?   Pharynx: Oropharynx is clear.  ?Eyes:  ?   Conjunctiva/sclera: Conjunctivae normal.  ?   Pupils: Pupils are equal, round, and reactive to light.  ?Neck:  ?   Thyroid: No thyromegaly.  ?Cardiovascular:  ?   Rate and Rhythm: Normal rate and regular rhythm.  ?   Heart sounds: Normal heart sounds. No murmur heard. ?Pulmonary:  ?   Effort: Pulmonary effort is normal. No respiratory distress.  ?   Breath sounds: Normal breath sounds.  ?Abdominal:  ?   General: There is no distension.  ?   Palpations: Abdomen is soft. There is no mass.  ?  Tenderness: There is no abdominal tenderness.  ?Musculoskeletal:     ?   General: Normal range of motion.  ?   Cervical back: Normal range of motion and neck supple.  ?Skin: ?   General: Skin is warm and dry.  ?Neurological:  ?   General: No focal deficit present.  ?   Mental Status: She is alert and oriented to person, place, and time.  ?Psychiatric:     ?   Mood and Affect: Mood normal.     ?   Behavior: Behavior normal.  ? ? ? ?  ? ?Assessment & Plan:  ? ?1. Annual physical exam ?Routine labs ordered ? ?2. Screening for endocrine/metabolic/immunity disorders ? ?- Vitamin D, 25-hydroxy ?- Hemoglobin A1c ?- TSH ?- Vitamin B12 ? ?3. Screening for lipid disorders ? ?- Lipid Panel ? ? ? ? ?Return in about 1 year (around 12/12/2022) for follow up, physical.  ? ?Becky Sax, MD ? ? ?

## 2021-12-12 LAB — LIPID PANEL
Chol/HDL Ratio: 2.6 ratio (ref 0.0–4.4)
Cholesterol, Total: 232 mg/dL — ABNORMAL HIGH (ref 100–199)
HDL: 90 mg/dL (ref >39)
LDL Chol Calc (NIH): 118 mg/dL — ABNORMAL HIGH (ref 0–99)
Triglycerides: 141 mg/dL (ref 0–149)
VLDL Cholesterol Cal: 24 mg/dL (ref 5–40)

## 2021-12-12 LAB — HEMOGLOBIN A1C
Est. average glucose Bld gHb Est-mCnc: 123 mg/dL
Hgb A1c MFr Bld: 5.9 % — ABNORMAL HIGH (ref 4.8–5.6)

## 2021-12-12 LAB — VITAMIN D 25 HYDROXY (VIT D DEFICIENCY, FRACTURES): Vit D, 25-Hydroxy: 25.4 ng/mL — ABNORMAL LOW (ref 30.0–100.0)

## 2021-12-12 LAB — VITAMIN B12: Vitamin B-12: 1040 pg/mL (ref 232–1245)

## 2021-12-12 LAB — TSH: TSH: 1.17 u[IU]/mL (ref 0.450–4.500)

## 2021-12-14 ENCOUNTER — Telehealth: Payer: Self-pay | Admitting: *Deleted

## 2021-12-14 NOTE — Telephone Encounter (Signed)
Patient is requesting Rx vit D for her low vitamin d results. Please advise

## 2021-12-17 ENCOUNTER — Other Ambulatory Visit: Payer: Self-pay | Admitting: Family Medicine

## 2021-12-17 MED ORDER — VITAMIN D (ERGOCALCIFEROL) 1.25 MG (50000 UNIT) PO CAPS: 50000.0000 [IU] | ORAL_CAPSULE | ORAL | 0 refills | Status: DC

## 2022-01-01 ENCOUNTER — Ambulatory Visit: Payer: Medicare Other | Admitting: Family Medicine

## 2022-01-08 LAB — HM MAMMOGRAPHY

## 2022-01-09 ENCOUNTER — Ambulatory Visit: Payer: Medicare Other | Admitting: Family

## 2022-01-25 ENCOUNTER — Encounter: Payer: Self-pay | Admitting: *Deleted

## 2022-02-06 ENCOUNTER — Telehealth: Payer: Self-pay | Admitting: Family Medicine

## 2022-02-06 NOTE — Telephone Encounter (Signed)
Copied from [facility] [phone]. Topic: General - Other >> Feb 06, 2022  3:14 PM Ja-Kwan M wrote: Reason for CRM: Pt requested to speak with Luellen Pucker. Cb# [phone]

## 2022-02-07 NOTE — Telephone Encounter (Signed)
Patient given appt 

## 2022-03-10 ENCOUNTER — Telehealth: Payer: Medicare Other | Admitting: Urgent Care

## 2022-03-10 DIAGNOSIS — Z298 Encounter for other specified prophylactic measures: Secondary | ICD-10-CM | POA: Diagnosis not present

## 2022-03-10 MED ORDER — ATOVAQUONE-PROGUANIL HCL 250-100 MG PO TABS
ORAL_TABLET | ORAL | 0 refills | Status: DC
Start: 2022-03-10 — End: 2022-12-18

## 2022-03-10 NOTE — Progress Notes (Signed)
Virtual Visit Consent   Christy Cruz, you are scheduled for a virtual visit with a [facility] provider today. Just as with appointments in the office, your consent must be obtained to participate. Your consent will be active for this visit and any virtual visit you may have with one of our providers in the next 365 days. If you have a MyChart account, a copy of this consent can be sent to you electronically.  As this is a virtual visit, video technology does not allow for your provider to perform a traditional examination. This may limit your provider's ability to fully assess your condition. If your provider identifies any concerns that need to be evaluated in person or the need to arrange testing (such as labs, EKG, etc.), we will make arrangements to do so. Although advances in technology are sophisticated, we cannot ensure that it will always work on either your end or our end. If the connection with a video visit is poor, the visit may have to be switched to a telephone visit. With either a video or telephone visit, we are not always able to ensure that we have a secure connection.  By engaging in this virtual visit, you consent to the provision of healthcare and authorize for your insurance to be billed (if applicable) for the services provided during this visit. Depending on your insurance coverage, you may receive a charge related to this service.  I need to obtain your verbal consent now. Are you willing to proceed with your visit today? Christy Cruz has provided verbal consent on 03/10/2022 for a virtual visit (video or telephone). Chaney Malling, PA  Date: 03/10/2022 3:13 PM  Virtual Visit via Video Note   I, [place], connected with  Christy Cruz  ([id number], 03/23/1951) on 03/10/22 at  3:15 PM EDT by a video-enabled telemedicine application and verified that I am speaking with the correct person using two identifiers.  Location: Patient: Virtual Visit Location Patient:  Home Provider: Virtual Visit Location Provider: Home Office   I discussed the limitations of evaluation and management by telemedicine and the availability of in person appointments. The patient expressed understanding and agreed to proceed.    History of Present Illness: Christy Cruz is a 71 y.o. female who is being seen today for malaria prophylaxis.  HPI: Pt is heading to [place] for a safari the beginning of September. She was instructed by the CDC and group she will be going with that malaria prophylaxis is required. Malarone is the preferred agent. Pt has no known allergies to these medications. She will be gone nearly a month but is requesting a few extra pills in case there is an issue with her return.   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   Other seborrheic keratosis 12/11/2021   Upper respiratory infection 12/11/2021   Skin tag 12/11/2021   Edema of left ankle 11/16/2021   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[place]) 11/16/2021   History of sepsis 11/01/2021   Hypocalcemia 11/01/2021   Liver cyst 11/01/2021   Neck pain 10/25/2021   Thrombocytopenia ([place]) 10/23/2021   Normocytic anemia 10/23/2021   Leukocytosis 10/23/2021   Lactic acidosis 10/23/2021   Cellulitis 10/22/2021   Aneurysm, 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 Start 2 days before travel, and continue for 7 days after return, Disp: 60 tablet, Rfl: 0   Glucosamine-Chondroit-Vit C-Mn (GLUCOSAMINE 1500 COMPLEX PO), , Disp: , Rfl:    Multiple Vitamins-Minerals (ICAPS AREDS 2 PO), Take 1 capsule by mouth 2 (two) times daily., Disp: , Rfl:    SUMAtriptan-naproxen (TREXIMET) 85-500 MG tablet, Take 1 tablet by mouth daily as needed for migraine., Disp: , Rfl:    Vitamin D, Ergocalciferol, (DRISDOL) 1.25 MG (50000 UNIT) CAPS capsule, Take 1 capsule (50,000 Units total) by mouth every 7 (seven) days., Disp: 12 capsule, Rfl: 0  Observations/Objective: Patient is well-developed, well-nourished in no acute distress.  Resting comfortably at home.  Head is normocephalic, atraumatic.  No labored breathing.  Speech is clear and coherent with logical content.  Patient is alert and oriented at baseline.    Assessment and Plan: 1. Need for malaria prophylaxis  Discussed medication use. Will need to start 2 days prior to travel, take while abroad, and continue until one week after return. All questions/ concerns addressed.  Follow Up Instructions: I discussed the assessment and treatment plan with the patient. The patient was provided an opportunity to ask questions and all were answered. The patient agreed with the plan and demonstrated an understanding of the instructions.  A copy of instructions were sent to the patient via MyChart unless otherwise noted below.    The patient was advised to call back or seek an in-person evaluation if the  symptoms worsen or if the condition fails to improve as anticipated.  Time:  I spent 6 minutes with the patient via telehealth technology discussing the above problems/concerns.    [place], PA

## 2022-03-10 NOTE — Patient Instructions (Signed)
  Christy Cruz, thank you for joining Chaney Malling, PA for today's virtual visit.  While this provider is not your primary care provider (PCP), if your PCP is located in our provider database this encounter information will be shared with them immediately following your visit.  Consent: (Patient) Christy Cruz provided verbal consent for this virtual visit at the beginning of the encounter.  Current Medications:  Current Outpatient Medications:    atovaquone-proguanil (MALARONE) 250-100 MG TABS tablet, Take one tab PO daily. Start 2 days before travel, and continue for 7 days after return, Disp: 60 tablet, Rfl: 0   Glucosamine-Chondroit-Vit C-Mn (GLUCOSAMINE 1500 COMPLEX PO), , Disp: , Rfl:    Multiple Vitamins-Minerals (ICAPS AREDS 2 PO), Take 1 capsule by mouth 2 (two) times daily., Disp: , Rfl:    SUMAtriptan-naproxen (TREXIMET) 85-500 MG tablet, Take 1 tablet by mouth daily as needed for migraine., Disp: , Rfl:    Vitamin D, Ergocalciferol, (DRISDOL) 1.25 MG (50000 UNIT) CAPS capsule, Take 1 capsule (50,000 Units total) by mouth every 7 (seven) days., Disp: 12 capsule, Rfl: 0   Medications ordered in this encounter:  Meds ordered this encounter  Medications   atovaquone-proguanil (MALARONE) 250-100 MG TABS tablet    Sig: Take one tab PO daily. Start 2 days before travel, and continue for 7 days after return    Dispense:  60 tablet    Refill:  0    Order Specific Question:   Supervising Provider    Answer:   Chase Picket [[id number]]     *If you need refills on other medications prior to your next appointment, please contact your pharmacy*  Follow-Up: Call back or seek an in-person evaluation if the symptoms worsen or if the condition fails to improve as anticipated.  Other Instructions Will need to start Malarone 2 days prior to travel, take while abroad, and continue until one week after return.   If you have been instructed to have an in-person evaluation today at a  local Urgent Care facility, please use the link below. It will take you to a list of all of our available [facility], including address, phone number and hours of operation. Please do not delay care.  [facility]  If you or a family member do not have a primary care provider, use the link below to schedule a visit and establish care. When you choose a [facility] primary care physician or advanced practice provider, you gain a long-term partner in health. Find a Primary Care Provider  Learn more about [facility] in-office and virtual care options: [facility]

## 2022-03-11 ENCOUNTER — Ambulatory Visit: Payer: Medicare Other | Admitting: Family Medicine

## 2022-03-15 ENCOUNTER — Ambulatory Visit: Payer: Medicare Other | Admitting: Family Medicine

## 2022-05-23 ENCOUNTER — Encounter (INDEPENDENT_AMBULATORY_CARE_PROVIDER_SITE_OTHER): Payer: Medicare Other | Admitting: Ophthalmology

## 2022-05-23 DIAGNOSIS — H353132 Nonexudative age-related macular degeneration, bilateral, intermediate dry stage: Secondary | ICD-10-CM

## 2022-05-23 DIAGNOSIS — H43813 Vitreous degeneration, bilateral: Secondary | ICD-10-CM

## 2022-05-23 DIAGNOSIS — H33301 Unspecified retinal break, right eye: Secondary | ICD-10-CM | POA: Diagnosis not present

## 2022-05-24 ENCOUNTER — Encounter: Payer: Self-pay | Admitting: Family Medicine

## 2022-05-24 ENCOUNTER — Encounter: Payer: Self-pay | Admitting: *Deleted

## 2022-09-05 ENCOUNTER — Ambulatory Visit: Payer: Medicare Other | Admitting: Physician Assistant

## 2022-10-10 ENCOUNTER — Telehealth: Payer: Self-pay | Admitting: *Deleted

## 2022-10-10 NOTE — Telephone Encounter (Signed)
Patient has been called to come pick up her medical records.  Copied from [facility] [phone]. Topic: General - Other >> Oct 08, 2022  8:55 AM Oley Balm A wrote: Reason for CRM: Pt states she gave Luellen Pucker a copy of her medical records for PCP and is wanting to know if she can come back and pick her copies up once they are scanned into the system. Please call pt back.

## 2022-12-02 ENCOUNTER — Other Ambulatory Visit: Payer: Self-pay

## 2022-12-02 DIAGNOSIS — I728 Aneurysm of other specified arteries: Secondary | ICD-10-CM

## 2022-12-02 DIAGNOSIS — M7989 Other specified soft tissue disorders: Secondary | ICD-10-CM

## 2022-12-06 ENCOUNTER — Telehealth: Payer: Self-pay | Admitting: Family Medicine

## 2022-12-06 NOTE — Telephone Encounter (Signed)
Called patient to schedule Medicare Annual Wellness Visit (AWV). No voicemail available to leave a message.  Last date of AWV: 12/11/21  Please transfer to Christy Cruz @ [phone]  Thank you ,  [name] AWV direct phone # [phone]

## 2022-12-06 NOTE — Telephone Encounter (Signed)
Contacted Christy Cruz to schedule their annual wellness visit. Patient declined to schedule AWV at this time.  Do not call   [name] AWV direct phone # [phone]

## 2022-12-15 ENCOUNTER — Ambulatory Visit (HOSPITAL_BASED_OUTPATIENT_CLINIC_OR_DEPARTMENT_OTHER)
Admission: RE | Admit: 2022-12-15 | Discharge: 2022-12-15 | Disposition: A | Discharge status: Home / Self Care | Payer: Medicare Other | Source: Ambulatory Visit | Attending: Vascular Surgery | Admitting: Vascular Surgery

## 2022-12-15 DIAGNOSIS — M79662 Pain in left lower leg: Secondary | ICD-10-CM | POA: Diagnosis present

## 2022-12-15 DIAGNOSIS — I728 Aneurysm of other specified arteries: Secondary | ICD-10-CM | POA: Diagnosis present

## 2022-12-15 DIAGNOSIS — M7989 Other specified soft tissue disorders: Secondary | ICD-10-CM | POA: Insufficient documentation

## 2022-12-15 MED ORDER — IOHEXOL 350 MG/ML SOLN
100.0000 mL | Freq: Once | INTRAVENOUS | Status: AC | PRN
  Administered 2022-12-15: 75 mL via INTRAVENOUS

## 2022-12-18 ENCOUNTER — Ambulatory Visit: Payer: Medicare Other | Admitting: Family Medicine

## 2022-12-18 VITALS — BP 120/79 | HR 85 | Temp 98.1°F | Resp 16 | Wt 119.2 lb

## 2022-12-18 DIAGNOSIS — Z0000 Encounter for general adult medical examination without abnormal findings: Secondary | ICD-10-CM | POA: Diagnosis not present

## 2022-12-18 DIAGNOSIS — Z13220 Encounter for screening for lipoid disorders: Secondary | ICD-10-CM

## 2022-12-18 DIAGNOSIS — Z130 Encounter for screening for diseases of the blood and blood-forming organs and certain disorders involving the immune mechanism: Secondary | ICD-10-CM

## 2022-12-18 DIAGNOSIS — L209 Atopic dermatitis, unspecified: Secondary | ICD-10-CM | POA: Insufficient documentation

## 2022-12-18 MED ORDER — AMOXICILLIN 875 MG PO TABS: 875.0000 mg | ORAL_TABLET | Freq: Two times a day (BID) | ORAL | 0 refills | Status: AC

## 2022-12-19 ENCOUNTER — Ambulatory Visit: Payer: Medicare Other | Admitting: Vascular Surgery

## 2022-12-19 ENCOUNTER — Encounter: Payer: Self-pay | Admitting: Vascular Surgery

## 2022-12-19 VITALS — BP 108/75 | HR 74 | Temp 97.9°F | Resp 20 | Ht 65.0 in | Wt 118.0 lb

## 2022-12-19 DIAGNOSIS — I728 Aneurysm of other specified arteries: Secondary | ICD-10-CM | POA: Diagnosis not present

## 2022-12-19 LAB — CBC WITH DIFFERENTIAL/PLATELET
Basophils Absolute: 0.1 10*3/uL (ref 0.0–0.2)
Basos: 1 %
EOS (ABSOLUTE): 0 10*3/uL (ref 0.0–0.4)
Eos: 0 %
Hematocrit: 43 % (ref 34.0–46.6)
Hemoglobin: 14.4 g/dL (ref 11.1–15.9)
Immature Grans (Abs): 0 10*3/uL (ref 0.0–0.1)
Immature Granulocytes: 0 %
Lymphocytes Absolute: 2 10*3/uL (ref 0.7–3.1)
Lymphs: 21 %
MCH: 29.5 pg (ref 26.6–33.0)
MCHC: 33.5 g/dL (ref 31.5–35.7)
MCV: 88 fL (ref 79–97)
Monocytes Absolute: 0.6 10*3/uL (ref 0.1–0.9)
Monocytes: 6 %
Neutrophils Absolute: 6.8 10*3/uL (ref 1.4–7.0)
Neutrophils: 72 %
Platelets: 248 10*3/uL (ref 150–450)
RBC: 4.88 x10E6/uL (ref 3.77–5.28)
RDW: 13 % (ref 11.7–15.4)
WBC: 9.5 10*3/uL (ref 3.4–10.8)

## 2022-12-19 LAB — CMP14+EGFR
ALT: 14 IU/L (ref 0–32)
AST: 24 IU/L (ref 0–40)
Albumin/Globulin Ratio: 2.3 — ABNORMAL HIGH (ref 1.2–2.2)
Albumin: 4.5 g/dL (ref 3.8–4.8)
Alkaline Phosphatase: 112 IU/L (ref 44–121)
BUN/Creatinine Ratio: 24 (ref 12–28)
BUN: 20 mg/dL (ref 8–27)
Bilirubin Total: 1.4 mg/dL — ABNORMAL HIGH (ref 0.0–1.2)
CO2: 22 mmol/L (ref 20–29)
Calcium: 9.2 mg/dL (ref 8.7–10.3)
Chloride: 100 mmol/L (ref 96–106)
Creatinine, Ser: 0.83 mg/dL (ref 0.57–1.00)
Globulin, Total: 2 g/dL (ref 1.5–4.5)
Glucose: 98 mg/dL (ref 70–99)
Potassium: 5.2 mmol/L (ref 3.5–5.2)
Sodium: 138 mmol/L (ref 134–144)
Total Protein: 6.5 g/dL (ref 6.0–8.5)
eGFR: 75 mL/min/{1.73_m2} (ref >59)

## 2022-12-19 LAB — LIPID PANEL
Chol/HDL Ratio: 2.7 ratio (ref 0.0–4.4)
Cholesterol, Total: 200 mg/dL — ABNORMAL HIGH (ref 100–199)
HDL: 73 mg/dL (ref >39)
LDL Chol Calc (NIH): 113 mg/dL — ABNORMAL HIGH (ref 0–99)
Triglycerides: 79 mg/dL (ref 0–149)
VLDL Cholesterol Cal: 14 mg/dL (ref 5–40)

## 2022-12-19 NOTE — Progress Notes (Signed)
-  Patient is here to have annually  complete physical examination  -Care gap address -labs taken  

## 2022-12-19 NOTE — Progress Notes (Signed)
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  It is calcified.  She is thin and I think we can potentially follow this with ultrasound.  I have ordered a mesenteric ultrasound in 1 year and hopefully we can see it without needing a CT scan.  If the aneurysm remains stable in size I think we can gradually increase her length to follow-up.  If there is been no change in a year I think we should go to a year and a half at the next visit.  I have explained that I will be retiring and she is agreeable to be seen by the PA at her next visit.  HPI:   Christy Cruz is a pleasant 72 y.o. female who I last saw on 11/14/2021.  She had been hospitalized in March 2023 with left lower extremity pain, fevers, and chills.  She likely had an insect bite with cellulitis.  During that hospitalization she had a CT of the abdomen and pelvis.  An incidental finding was a 1.3 cm splenic artery aneurysm. Given her age and that she is not of childbearing age we were simply following this.  Of note, I also saw her at that time with cellulitis and swelling of the left leg which was likely secondary to an insect bite.  Since I saw her last, the issue with her leg is completely resolved.  She has had no further problems with leg swelling.  She denies any abdominal pain or back pain.  She has had no hematuria.  History reviewed. No pertinent past medical history.  History reviewed. No pertinent family history.  SOCIAL HISTORY: Social History   Tobacco Use   Smoking status: Never   Smokeless tobacco: Never  Substance Use Topics   Alcohol use: Never    No Known Allergies  Current Outpatient Medications  Medication Sig Dispense Refill   amoxicillin (AMOXIL) 875 MG tablet Take 1 tablet (875 mg total) by mouth 2 (two) times daily for 10 days. 20 tablet 0   Glucosamine-Chondroit-Vit C-Mn  (GLUCOSAMINE 1500 COMPLEX PO)      Multiple Vitamins-Minerals (ICAPS AREDS 2 PO) Take 1 capsule by mouth 2 (two) times daily.     No current facility-administered medications for this visit.    REVIEW OF SYSTEMS:  [X]  denotes positive finding, [ ]  denotes negative finding Cardiac  Comments:  Chest pain or chest pressure:    Shortness of breath upon exertion:    Short of breath when lying flat:    Irregular heart rhythm:        Vascular    Pain in calf, thigh, or hip brought on by ambulation:    Pain in feet at night that wakes you up from your sleep:     Blood clot in your veins:    Leg swelling:         Pulmonary    Oxygen at home:    Productive cough:     Wheezing:         Neurologic    Sudden weakness in arms or legs:     Sudden numbness in arms or legs:     Sudden onset of difficulty speaking or slurred speech:    Temporary loss of vision in one eye:     Problems with dizziness:         Gastrointestinal  Blood in stool:     Vomited blood:         Genitourinary    Burning when urinating:     Blood in urine:        Psychiatric    Major depression:         Hematologic    Bleeding problems:    Problems with blood clotting too easily:        Skin    Rashes or ulcers:        Constitutional    Fever or chills:     PHYSICAL EXAM:   Vitals:   12/19/22 0919  BP: 108/75  Pulse: 74  Resp: 20  Temp: 97.9 F (36.6 C)  SpO2: 98%  Weight: 118 lb (53.5 kg)  Height: 5\' 5"  (1.651 m)    GENERAL: The patient is a well-nourished female, in no acute distress. The vital signs are documented above. CARDIAC: There is a regular rate and rhythm.  VASCULAR: I do not detect carotid bruits. She has palpable femoral and pedal pulses bilaterally. PULMONARY: There is good air exchange bilaterally without wheezing or rales. ABDOMEN: Soft and non-tender with normal pitched bowel sounds.  MUSCULOSKELETAL: There are no major deformities or cyanosis. NEUROLOGIC: No focal  weakness or paresthesias are detected. SKIN: There are no ulcers or rashes noted. PSYCHIATRIC: The patient has a normal affect.  DATA:    CT ABDOMEN: This shows that her splenic artery aneurysm is unchanged in size.  It measures 1.3 cm in maximum diameter.  It is calcified.  It is near the hilum of the spleen.  Waverly [facility] [phone]

## 2022-12-24 ENCOUNTER — Encounter: Payer: Self-pay | Admitting: Family Medicine

## 2022-12-24 NOTE — Progress Notes (Signed)
Established Patient Office Visit  Subjective    Patient ID: Christy Cruz, female    DOB: January 20, 1951  Age: 72 y.o. MRN: [id number]  CC:  Chief Complaint  Patient presents with   Follow-up    HPI Christy Cruz presents for routine annual exam. Patient denies acute complaints or concerns.   Outpatient Encounter Medications as of 12/18/2022  Medication Sig   amoxicillin (AMOXIL) 875 MG tablet Take 1 tablet (875 mg total) by mouth 2 (two) times daily for 10 days.   Glucosamine-Chondroit-Vit C-Mn (GLUCOSAMINE 1500 COMPLEX PO)    Multiple Vitamins-Minerals (ICAPS AREDS 2 PO) Take 1 capsule by mouth 2 (two) times daily.   [DISCONTINUED] atovaquone-proguanil (MALARONE) 250-100 MG TABS tablet Take one tab PO daily. Start 2 days before travel, and continue for 7 days after return   [DISCONTINUED] SUMAtriptan-naproxen (TREXIMET) 85-500 MG tablet Take 1 tablet by mouth daily as needed for migraine.   [DISCONTINUED] Vitamin D, Ergocalciferol, (DRISDOL) 1.25 MG (50000 UNIT) CAPS capsule Take 1 capsule (50,000 Units total) by mouth every 7 (seven) days.   No facility-administered encounter medications on file as of 12/18/2022.    No past medical history on file.  Past Surgical History:  Procedure Laterality Date   OOPHORECTOMY     TONSILLECTOMY      No family history on file.  Social History   Socioeconomic History   Marital status: Married    Spouse name: Not on file   Number of children: Not on file   Years of education: Not on file   Highest education level: Not on file  Occupational History   Not on file  Tobacco Use   Smoking status: Never   Smokeless tobacco: Never  Substance and Sexual Activity   Alcohol use: Never   Drug use: Never   Sexual activity: Yes  Other Topics Concern   Not on file  Social History Narrative   Not on file   Social Determinants of Health   Financial Resource Strain: Low Risk  (12/19/2022)   Overall Financial Resource Strain (CARDIA)     Difficulty of Paying Living Expenses: Not very hard  Food Insecurity: No Food Insecurity (12/19/2022)   Hunger Vital Sign    Worried About Running Out of Food in the Last Year: Never true    Ran Out of Food in the Last Year: Never true  Transportation Needs: No Transportation Needs (12/19/2022)   PRAPARE - Administrator, Civil Service (Medical): No    Lack of Transportation (Non-Medical): No  Physical Activity: Insufficiently Active (12/19/2022)   Exercise Vital Sign    Days of Exercise per Week: 3 days    Minutes of Exercise per Session: 30 min  Stress: No Stress Concern Present (12/19/2022)   [employer] of Occupational Health - Occupational Stress Questionnaire    Feeling of Stress : Not at all  Social Connections: Socially Integrated (12/19/2022)   Social Connection and Isolation Panel [NHANES]    Frequency of Communication with Friends and Family: More than three times a week    Frequency of Social Gatherings with Friends and Family: Twice a week    Attends Religious Services: 1 to 4 times per year    Active Member of [employer] or Organizations: Patient unable to answer    Attends Banker Meetings: 1 to 4 times per year    Marital Status: Married  Catering manager Violence: Not At Risk (12/19/2022)   Humiliation, Afraid, Rape, and Kick questionnaire  Fear of Current or Ex-Partner: No    Emotionally Abused: No    Physically Abused: No    Sexually Abused: No    Review of Systems  All other systems reviewed and are negative.       Objective    BP 120/79   Pulse 85   Temp 98.1 F (36.7 C) (Oral)   Resp 16   Wt 119 lb 3.2 oz (54.1 kg)   SpO2 95%   BMI 19.84 kg/m   Physical Exam Vitals and nursing note reviewed.  Constitutional:      General: She is not in acute distress. HENT:     Head: Normocephalic and atraumatic.     Right Ear: Tympanic membrane, ear canal and external ear normal.     Left Ear: Tympanic membrane, ear canal and  external ear normal.     Nose: Nose normal.     Mouth/Throat:     Mouth: Mucous membranes are moist.     Pharynx: Oropharynx is clear.  Eyes:     Conjunctiva/sclera: Conjunctivae normal.     Pupils: Pupils are equal, round, and reactive to light.  Neck:     Thyroid: No thyromegaly.  Cardiovascular:     Rate and Rhythm: Normal rate and regular rhythm.     Heart sounds: Normal heart sounds. No murmur heard. Pulmonary:     Effort: Pulmonary effort is normal. No respiratory distress.     Breath sounds: Normal breath sounds.  Abdominal:     General: There is no distension.     Palpations: Abdomen is soft. There is no mass.     Tenderness: There is no abdominal tenderness.  Musculoskeletal:        General: Normal range of motion.     Cervical back: Normal range of motion and neck supple.  Skin:    General: Skin is warm and dry.  Neurological:     General: No focal deficit present.     Mental Status: She is alert and oriented to person, place, and time.  Psychiatric:        Mood and Affect: Mood normal.        Behavior: Behavior normal.          Assessment & Plan:   1. Annual physical exam  - CMP14+EGFR  2. Screening for deficiency anemia - CBC with Differential  3. Screening for lipid disorders  - Lipid Panel    No follow-ups on file.   Christy Raymond, MD

## 2022-12-27 ENCOUNTER — Other Ambulatory Visit (HOSPITAL_COMMUNITY): Payer: Medicare Other

## 2022-12-30 ENCOUNTER — Other Ambulatory Visit: Payer: Self-pay

## 2022-12-30 DIAGNOSIS — I728 Aneurysm of other specified arteries: Secondary | ICD-10-CM

## 2023-01-10 LAB — HM MAMMOGRAPHY

## 2023-01-14 ENCOUNTER — Encounter: Payer: Self-pay | Admitting: Family Medicine

## 2023-01-15 ENCOUNTER — Telehealth: Payer: Medicare Other | Admitting: Physician Assistant

## 2023-01-15 DIAGNOSIS — L237 Allergic contact dermatitis due to plants, except food: Secondary | ICD-10-CM

## 2023-01-15 MED ORDER — PREDNISONE 10 MG PO TABS
ORAL_TABLET | ORAL | 0 refills | Status: AC
Start: 2023-01-15 — End: 2023-01-28

## 2023-01-15 NOTE — Progress Notes (Signed)
I have spent 5 minutes in review of e-visit questionnaire, review and updating patient chart, medical decision making and response to patient.   Allona Gondek Cody Oran Dillenburg, PA-C    

## 2023-01-15 NOTE — Progress Notes (Signed)

## 2023-01-17 ENCOUNTER — Other Ambulatory Visit: Payer: Self-pay | Admitting: Family Medicine

## 2023-01-17 ENCOUNTER — Encounter: Payer: Self-pay | Admitting: Family Medicine

## 2023-01-23 ENCOUNTER — Ambulatory Visit
Admission: EM | Admit: 2023-01-23 | Discharge: 2023-01-23 | Disposition: A | Discharge status: Home / Self Care | Payer: Medicare Other | Attending: Family Medicine | Admitting: Family Medicine

## 2023-01-23 DIAGNOSIS — J209 Acute bronchitis, unspecified: Secondary | ICD-10-CM

## 2023-01-23 MED ORDER — AZITHROMYCIN 250 MG PO TABS: ORAL_TABLET | ORAL | 0 refills | Status: DC

## 2023-01-23 MED ORDER — BENZONATATE 200 MG PO CAPS: 200.0000 mg | ORAL_CAPSULE | Freq: Two times a day (BID) | ORAL | 0 refills | Status: DC | PRN

## 2023-01-23 NOTE — ED Triage Notes (Signed)
Pt presents to [facility] with co of cough for 3 weeks. Pt reports both daughter and granddaughter have same cough. Cough is productive but clear. No otc meds

## 2023-01-23 NOTE — Discharge Instructions (Signed)
Take tessalon 2-3 times a day IN ADDITION take a DM product like delsym or mucinex DM 2 x a day Drink lots of water If available, run a humidifier int he bedroom Take z pak as instructed, 2 today then one a day until gone Call for problems

## 2023-01-23 NOTE — ED Provider Notes (Signed)
[facility]    CSN: [id number] Arrival date & time: 01/23/23  1354      History   Chief Complaint Chief Complaint  Patient presents with   Cough    HPI Christy Cruz is a 72 y.o. female.   HPI  Cough for 3 weeks.  States that her daughter and granddaughter have been coughing for this long also.  Granddaughter brought home a cough from daycare.  She feels like she has some chest congestion.  Productive cough.  No chest pain.  No shortness of breath.  Chest feels a "burning" with cough.  Cough is keeping her awake at night.  Not a lot of sinus congestion or postnasal drip.  States that when she has to cough it is hard for her to get rid of it.  Non-smoker.  No asthma or lung disease.   No past medical history on file.  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   Healing wound 12/11/2021   Hemangioma of skin and subcutaneous tissue 12/11/2021   History of malignant neoplasm of skin 12/11/2021   Lentigo 12/11/2021   Melanocytic nevi of left upper limb, including shoulder 12/11/2021   Melanocytic nevi of trunk 12/11/2021   Migraine 12/11/2021   Osteoarthritis 12/11/2021   Other skin changes due to chronic exposure to nonionizing radiation 12/11/2021   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   Other seborrheic keratosis 12/11/2021   Upper respiratory infection 12/11/2021   Skin tag 12/11/2021   Edema of left ankle 11/16/2021   Reactive arthritis of left ankle (HCC) 11/16/2021   History of sepsis 11/01/2021   Hypocalcemia 11/01/2021   Liver cyst 11/01/2021   Neck pain 10/25/2021   Thrombocytopenia (HCC) 10/23/2021   Normocytic anemia 10/23/2021   Leukocytosis 10/23/2021   Lactic acidosis 10/23/2021   Cellulitis 10/22/2021   Aneurysm, 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      Home Medications    Prior to Admission medications   Medication Sig Start Date End Date Taking? Authorizing Provider  azithromycin (ZITHROMAX Z-PAK) 250 MG tablet Take two pills today followed by one a day until gone 01/23/23  Yes Eustace Moore, MD  benzonatate (TESSALON) 200 MG capsule Take 1 capsule (200 mg total) by mouth 2 (two) times daily as needed for cough. 01/23/23  Yes Eustace Moore, MD  Glucosamine-Chondroit-Vit C-Mn (GLUCOSAMINE 1500 COMPLEX PO)     [provider]  Multiple Vitamins-Minerals (ICAPS AREDS 2 PO) Take 1 capsule by mouth 2 (two) times daily.    [provider]  predniSONE (DELTASONE) 10 MG tablet Take 4 tablets (40 mg total) by mouth daily with breakfast for 4 days, THEN 3 tablets (30 mg total) daily with breakfast for 4 days, THEN 2 tablets (20 mg total) daily with breakfast for 3 days, THEN 1 tablet (10 mg total) daily  with breakfast for 3 days. 01/15/23 01/28/23  Waldon Merl, PA-C    Family History No family history on file.  Social History Social History   Tobacco Use   Smoking status: Never   Smokeless tobacco: Never  Substance Use Topics   Alcohol use: Never   Drug use: Never     Allergies   Patient has no known allergies.   Review of Systems Review of Systems  See HPI Physical Exam Triage Vital Signs ED Triage Vitals  Enc Vitals Group     BP 01/23/23 1432 120/79     Pulse Rate 01/23/23 1432 88     Resp 01/23/23 1432 16     Temp  01/23/23 1432 98.3 F (36.8 C)     Temp src --      SpO2 01/23/23 1432 96 %     Weight --      Height --      Head Circumference --      Peak Flow --      Pain Score 01/23/23 1429 0     Pain Loc --      Pain Edu? --      Excl. in GC? --    No data found.  Updated Vital Signs BP 120/79   Pulse 88   Temp 98.3 F (36.8 C)   Resp 16   SpO2 96%       Physical Exam Vitals reviewed.  Constitutional:      General: She is not in acute distress.    Appearance: She is well-developed and normal weight. She is not ill-appearing.  HENT:     Head: Normocephalic and atraumatic.     Right Ear: Tympanic membrane normal.     Left Ear: Tympanic membrane and ear canal normal.     Nose: Nose normal.     Mouth/Throat:     Mouth: Mucous membranes are moist.  Eyes:     Conjunctiva/sclera: Conjunctivae normal.     Pupils: Pupils are equal, round, and reactive to light.  Cardiovascular:     Rate and Rhythm: Normal rate and regular rhythm.     Heart sounds: Normal heart sounds.  Pulmonary:     Effort: Pulmonary effort is normal. No respiratory distress.     Breath sounds: Normal breath sounds. No wheezing or rhonchi.  Abdominal:     General: There is no distension.     Palpations: Abdomen is soft.  Musculoskeletal:        General: Normal range of motion.     Cervical back: Normal range of motion.  Lymphadenopathy:     Cervical: No cervical adenopathy.  Skin:    General: Skin is warm and dry.  Neurological:     Mental Status: She is alert.      UC Treatments / Results  Labs (all labs ordered are listed, but only abnormal results are displayed) Labs Reviewed - No data to display  EKG   Radiology No results found.  Procedures Procedures (including critical care time)  Medications Ordered in UC Medications - No data to display  Initial Impression / Assessment and Plan / UC Course  I have reviewed the triage vital signs and the nursing notes.  Pertinent labs & imaging  results that were available during my care of the patient were reviewed by me and considered in my medical decision making (see chart for details).   Discussed that most acute bronchitis is caused by a virus.  After 3 weeks of  conservative treatment, will consider antibiotic treatment.  Stronger cough management.  Final Clinical Impressions(s) / UC Diagnoses   Final diagnoses:  Acute bronchitis, unspecified organism     Discharge Instructions      Take tessalon 2-3 times a day IN ADDITION take a DM product like delsym or mucinex DM 2 x a day Drink lots of water If available, run a humidifier int he bedroom Take z pak as instructed, 2 today then one a day until gone Call for problems   ED Prescriptions     Medication Sig Dispense Auth. Provider   azithromycin (ZITHROMAX Z-PAK) 250 MG tablet Take two pills today followed by one a day until gone 6 tablet Eustace Moore, MD   benzonatate (TESSALON) 200 MG capsule Take 1 capsule (200 mg total) by mouth 2 (two) times daily as needed for cough. 20 capsule Eustace Moore, MD      PDMP not reviewed this encounter.   Eustace Moore, MD 01/23/23 [phone]

## 2023-02-07 LAB — COLOGUARD: COLOGUARD: NEGATIVE

## 2023-03-26 ENCOUNTER — Ambulatory Visit (INDEPENDENT_AMBULATORY_CARE_PROVIDER_SITE_OTHER): Payer: Medicare Other

## 2023-03-26 ENCOUNTER — Ambulatory Visit: Payer: Medicare Other | Admitting: Sports Medicine

## 2023-03-26 ENCOUNTER — Encounter: Payer: Self-pay | Admitting: Sports Medicine

## 2023-03-26 DIAGNOSIS — Z7184 Encounter for health counseling related to travel: Secondary | ICD-10-CM

## 2023-03-26 DIAGNOSIS — M8580 Other specified disorders of bone density and structure, unspecified site: Secondary | ICD-10-CM

## 2023-03-26 DIAGNOSIS — Z0000 Encounter for general adult medical examination without abnormal findings: Secondary | ICD-10-CM | POA: Insufficient documentation

## 2023-03-26 DIAGNOSIS — M47816 Spondylosis without myelopathy or radiculopathy, lumbar region: Secondary | ICD-10-CM | POA: Diagnosis not present

## 2023-03-26 DIAGNOSIS — R011 Cardiac murmur, unspecified: Secondary | ICD-10-CM | POA: Insufficient documentation

## 2023-03-26 DIAGNOSIS — I340 Nonrheumatic mitral (valve) insufficiency: Secondary | ICD-10-CM | POA: Insufficient documentation

## 2023-03-26 DIAGNOSIS — Z23 Encounter for immunization: Secondary | ICD-10-CM

## 2023-03-26 MED ORDER — ACETAMINOPHEN ER 650 MG PO TBCR
650.0000 mg | EXTENDED_RELEASE_TABLET | Freq: Three times a day (TID) | ORAL | Status: DC | PRN
Start: 2023-03-26 — End: 2023-05-19

## 2023-03-26 MED ORDER — ACETAZOLAMIDE 125 MG PO TABS: ORAL_TABLET | ORAL | 3 refills | Status: DC

## 2023-03-26 MED ORDER — CIPROFLOXACIN HCL 500 MG PO TABS
500.0000 mg | ORAL_TABLET | Freq: Two times a day (BID) | ORAL | 0 refills | Status: DC
Start: 2023-03-26 — End: 2023-05-19

## 2023-03-26 NOTE — Assessment & Plan Note (Addendum)
Last bone density test 2021, ordering updated. If osteopenia noted she will need calcium and vitamin D supplementation.  Update: Bone density test shows osteopenia but not osteoporosis, treatment is weightbearing exercise, resistance training and calcium/vitamin D supplementation twice a day.

## 2023-03-26 NOTE — Assessment & Plan Note (Signed)
Pleasant 72 year old female, she is up-to-date on all screenings with the exception of Shingrix vaccination and bone densitometry, I will going order a bone density test. She will let me know if she would like to proceed with Shingrix vaccination, we would need to send this to her pharmacy. She had routine labs done earlier this year and they were acceptable.

## 2023-03-26 NOTE — Progress Notes (Addendum)
Subjective:    CC: Establish care  HPI:  This patient is here to establish care.  I reviewed the past medical history, family history, social history, surgical history, and allergies today and no changes were needed.  Please see the problem list section below in epic for further details.  Past Medical History: No past medical history on file. Past Surgical History: Past Surgical History:  Procedure Laterality Date   OOPHORECTOMY     TONSILLECTOMY     Social History: Social History   Socioeconomic History   Marital status: Married    Spouse name: Not on file   Number of children: Not on file   Years of education: Not on file   Highest education level: Not on file  Occupational History   Not on file  Tobacco Use   Smoking status: Never   Smokeless tobacco: Never  Substance and Sexual Activity   Alcohol use: Never   Drug use: Never   Sexual activity: Yes  Other Topics Concern   Not on file  Social History Narrative   Not on file   Social Determinants of Health   Financial Resource Strain: Low Risk  (12/19/2022)   Overall Financial Resource Strain (CARDIA)    Difficulty of Paying Living Expenses: Not very hard  Food Insecurity: No Food Insecurity (12/19/2022)   Hunger Vital Sign    Worried About Running Out of Food in the Last Year: Never true    Ran Out of Food in the Last Year: Never true  Transportation Needs: No Transportation Needs (12/19/2022)   PRAPARE - Administrator, Civil Service (Medical): No    Lack of Transportation (Non-Medical): No  Physical Activity: Insufficiently Active (12/19/2022)   Exercise Vital Sign    Days of Exercise per Week: 3 days    Minutes of Exercise per Session: 30 min  Stress: No Stress Concern Present (12/19/2022)   [employer] of Occupational Health - Occupational Stress Questionnaire    Feeling of Stress : Not at all  Social Connections: Socially Integrated (12/19/2022)   Social Connection and Isolation Panel  [NHANES]    Frequency of Communication with Friends and Family: More than three times a week    Frequency of Social Gatherings with Friends and Family: Twice a week    Attends Religious Services: 1 to 4 times per year    Active Member of [employer] or Organizations: Patient unable to answer    Attends Banker Meetings: 1 to 4 times per year    Marital Status: Married   Family History: No family history on file. Allergies: No Known Allergies Medications: See med rec.  Review of Systems: No headache, visual changes, nausea, vomiting, diarrhea, constipation, dizziness, abdominal pain, skin rash, fevers, chills, night sweats, swollen lymph nodes, weight loss, chest pain, body aches, joint swelling, muscle aches, shortness of breath, mood changes, visual or auditory hallucinations.  Objective:    General: Well Developed, well nourished, and in no acute distress.  Neuro: Alert and oriented x3, extra-ocular muscles intact, sensation grossly intact.  HEENT: Normocephalic, atraumatic, pupils equal round reactive to light, neck supple, no masses, no lymphadenopathy, thyroid nonpalpable. Oropharynx, nasopharynx, external ear canals are unremarkable. Skin: Warm and dry, no rashes noted.  Cardiac: Regular rate and rhythm, 1/6 systolic ejection murmur left second intercostal space. Respiratory: Clear to auscultation bilaterally. Not using accessory muscles, speaking in full sentences.  Abdominal: Soft, nontender, nondistended, no masses, no organomegaly.   Impression and Recommendations:  The patient was counselled, risk factors were discussed, anticipatory guidance given.  Annual physical exam Pleasant 72 year old female, she is up-to-date on all screenings with the exception of Shingrix vaccination and bone densitometry, I will going order a bone density test. She will let me know if she would like to proceed with Shingrix vaccination, we would need to send this to her pharmacy. She  had routine labs done earlier this year and they were acceptable.  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 We discussed the anatomy and pathophysiology, she will do arthritis Tylenol, updated x-rays and home conditioning.  Osteopenia Last bone density test 2021, ordering updated. If osteopenia noted she will need calcium and vitamin D supplementation.  Update: Bone density test shows osteopenia but not osteoporosis, treatment is weightbearing exercise, resistance training and calcium/vitamin D supplementation twice a day.  Systolic murmur Incidentally noted faint 1/6 systolic ejection murmur left second intercostal space, not heard with every single beat, suspect mild aortic stenosis, we will get an echocardiogram to confirm.  Travel advice encounter Will be traveling to [place] and [place], we consulted the [employer], she is going to need malaria prophylaxis, acetazolamide for acute mountain sickness prophylaxis, yellow fever vaccination, typhoid vaccination. We will also do Cipro for traveler's diarrhea prevention. She will let me know if she would like to proceed with yellow fever, typhoid, malaria prophylaxis.  ____________________________________________ Ihor Austin. Benjamin Stain, M.D., ABFM., CAQSM., AME. Primary Care and Sports Medicine [facility]  Adjunct Professor of Family Medicine  [employer] [facility] [employer]  Restaurant manager, fast food

## 2023-03-26 NOTE — Assessment & Plan Note (Signed)
Will be traveling to [place] and [place], we consulted the [employer], she is going to need malaria prophylaxis, acetazolamide for acute mountain sickness prophylaxis, yellow fever vaccination, typhoid vaccination. We will also do Cipro for traveler's diarrhea prevention. She will let me know if she would like to proceed with yellow fever, typhoid, malaria prophylaxis.

## 2023-03-26 NOTE — Addendum Note (Signed)
Addended by: Carren Rang A on: 03/26/2023 11:34 AM   Modules accepted: Orders

## 2023-03-26 NOTE — Assessment & Plan Note (Signed)
Incidentally noted faint 1/6 systolic ejection murmur left second intercostal space, not heard with every single beat, suspect mild aortic stenosis, we will get an echocardiogram to confirm.

## 2023-03-26 NOTE — Assessment & Plan Note (Signed)
Chronic axial low back pain worse with spinal extension, I did personally review a abdominal and pelvic CT that did show very severe L4-S1 bilateral facet arthritis with L5-S1 spondylolisthesis. We discussed the anatomy and pathophysiology, she will do arthritis Tylenol, updated x-rays and home conditioning.

## 2023-03-27 MED ORDER — SHINGRIX 50 MCG/0.5ML IM SUSR
0.5000 mL | Freq: Once | INTRAMUSCULAR | 0 refills | Status: AC
Start: 2023-03-27 — End: 2023-03-27

## 2023-04-09 ENCOUNTER — Ambulatory Visit (INDEPENDENT_AMBULATORY_CARE_PROVIDER_SITE_OTHER): Payer: Medicare Other

## 2023-04-09 DIAGNOSIS — M8580 Other specified disorders of bone density and structure, unspecified site: Secondary | ICD-10-CM

## 2023-04-10 MED ORDER — CALCIUM 600+D PLUS MINERALS 600-400 MG-UNIT PO TABS
ORAL_TABLET | ORAL | Status: DC
Start: 2023-04-10 — End: 2023-05-19

## 2023-04-10 NOTE — Addendum Note (Signed)
Addended by: Monica Becton on: 04/10/2023 09:38 AM   Modules accepted: Orders

## 2023-04-19 ENCOUNTER — Encounter (HOSPITAL_COMMUNITY): Payer: Self-pay | Admitting: Emergency Medicine

## 2023-04-19 ENCOUNTER — Ambulatory Visit (HOSPITAL_COMMUNITY)
Admission: EM | Admit: 2023-04-19 | Discharge: 2023-04-19 | Disposition: A | Discharge status: Home / Self Care | Payer: Medicare Other | Attending: Internal Medicine | Admitting: Internal Medicine

## 2023-04-19 ENCOUNTER — Other Ambulatory Visit: Payer: Self-pay

## 2023-04-19 DIAGNOSIS — Z23 Encounter for immunization: Secondary | ICD-10-CM | POA: Diagnosis not present

## 2023-04-19 DIAGNOSIS — S61214A Laceration without foreign body of right ring finger without damage to nail, initial encounter: Secondary | ICD-10-CM

## 2023-04-19 MED ORDER — LIDOCAINE HCL 2 % IJ SOLN
INTRAMUSCULAR | Status: AC
  Filled 2023-04-19: qty 20

## 2023-04-19 MED ORDER — MUPIROCIN 2 % EX OINT: 1.0000 | TOPICAL_OINTMENT | Freq: Two times a day (BID) | CUTANEOUS | 0 refills | Status: DC

## 2023-04-19 MED ORDER — TETANUS-DIPHTH-ACELL PERTUSSIS 5-2.5-18.5 LF-MCG/0.5 IM SUSY
0.5000 mL | PREFILLED_SYRINGE | Freq: Once | INTRAMUSCULAR | Status: AC
  Administered 2023-04-19: 0.5 mL via INTRAMUSCULAR

## 2023-04-19 MED ORDER — DOXYCYCLINE HYCLATE 100 MG PO CAPS: 100.0000 mg | ORAL_CAPSULE | Freq: Two times a day (BID) | ORAL | 0 refills | Status: AC

## 2023-04-19 MED ORDER — TETANUS-DIPHTH-ACELL PERTUSSIS 5-2.5-18.5 LF-MCG/0.5 IM SUSY
PREFILLED_SYRINGE | INTRAMUSCULAR | Status: AC
  Filled 2023-04-19: qty 0.5

## 2023-04-19 NOTE — Discharge Instructions (Signed)
Leave dressing in place until tomorrow. Remove dressing tomorrow and may wash with soap and water but do not submerge until the sutures are removed Avoid heavy activity with the right hand for 7 days Mupirocin twice daily to area as needed Start Doxycycline twice daily for 7 days. Take it with food Sutures out in 10 days

## 2023-04-19 NOTE — ED Triage Notes (Signed)
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  Injury to right finger tip.  Laceration to right ring finger tip.  No bleeding.  Patient is very anxious, has been encouraged to slow breathing.  On arrival to treatment room, respiratory rate 36 bpm and dep breaths.

## 2023-04-19 NOTE — ED Provider Notes (Addendum)
[facility]    CSN: [id number] Arrival date & time: 04/19/23  1216      History   Chief Complaint Chief Complaint  Patient presents with   Laceration    HPI Christy Cruz is a 72 y.o. female.   72 year old female who was trimming her hedges at home at around noon.  She was almost done and went to grab the last branch out of the hedge trimmer however the machine was still moving and cut her finger.  She immediately applied pressure and rinse the area off and noticed a gash.  She came to urgent care for further evaluation.  She still has neurological sensation in the distal finger.  There is no evidence of tendon injury.  She does note that she is getting ready to go out of the country home Wednesday for 3 weeks.   Laceration Associated symptoms: no fever and no rash     History reviewed. No pertinent past medical history.  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      Home Medications    Prior to Admission medications   Medication Sig Start Date End Date Taking? Authorizing Provider  doxycycline (VIBRAMYCIN) 100 MG capsule  Take 1 capsule (100 mg total) by mouth 2 (two) times daily for 7 days. 04/19/23 04/26/23 Yes Addisen Chappelle A, PA-C  mupirocin ointment (BACTROBAN) 2 % Apply 1 Application topically 2 (two) times daily. 04/19/23  Yes Aryn Safran A, PA-C  acetaminophen (TYLENOL) 650 MG CR tablet Take 1 tablet (650 mg total) by mouth every 8 (eight) hours as needed for pain. 03/26/23   Monica Becton, MD  acetaZOLAMIDE (DIAMOX) 125 MG tablet 1 tab p.o. twice daily, start 24 hours before day of ascent, and discontinue 2 to 3 days after peak arrival or upon descent 03/26/23   Monica Becton, MD  Calcium Carbonate-Vit D-Min (CALCIUM 600+D PLUS MINERALS) 600-400 MG-UNIT TABS 1 tab p.o. twice daily 04/10/23   Monica Becton, MD  ciprofloxacin (CIPRO) 500 MG tablet Take 1 tablet (500 mg total) by mouth 2 (two) times daily. Patient not taking: Reported on 04/19/2023 03/26/23   Monica Becton, MD  Glucosamine-Chondroit-Vit C-Mn (GLUCOSAMINE 1500 COMPLEX PO)     [provider]  Multiple Vitamins-Minerals (ICAPS AREDS 2 PO) Take 1 capsule by mouth 2 (two) times daily.    [provider]    Family History History reviewed. No pertinent family history.  Social History Social History   Tobacco Use   Smoking status: Never   Smokeless tobacco: Never  Vaping Use   Vaping status: Never Used  Substance Use Topics  Alcohol use: Never   Drug use: Never     Allergies   Patient has no known allergies.   Review of Systems Review of Systems  Constitutional:  Negative for chills and fever.  HENT:  Negative for ear pain and sore throat.   Eyes:  Negative for pain and visual disturbance.  Respiratory:  Negative for cough and shortness of breath.   Cardiovascular:  Negative for chest pain and palpitations.  Gastrointestinal:  Negative for abdominal pain and vomiting.  Genitourinary:  Negative for dysuria and hematuria.  Musculoskeletal:  Negative for arthralgias and back  pain.  Skin:  Positive for wound (Right ring finger). Negative for color change and rash.  Neurological:  Negative for seizures and syncope.  All other systems reviewed and are negative.    Physical Exam Triage Vital Signs ED Triage Vitals  Encounter Vitals Group     BP 04/19/23 1241 112/74     Systolic BP Percentile --      Diastolic BP Percentile --      Pulse --      Resp 04/19/23 1241 18     Temp 04/19/23 1241 (!) 97.3 F (36.3 C)     Temp Source 04/19/23 1241 Oral     SpO2 04/19/23 1241 98 %     Weight --      Height --      Head Circumference --      Peak Flow --      Pain Score 04/19/23 1239 5     Pain Loc --      Pain Education --      Exclude from Growth Chart --    No data found.  Updated Vital Signs BP 112/74 (BP Location: Right Arm)   Temp (!) 97.3 F (36.3 C) (Oral)   Resp 18   SpO2 98%   Visual Acuity Right Eye Distance:   Left Eye Distance:   Bilateral Distance:    Right Eye Near:   Left Eye Near:    Bilateral Near:     Physical Exam Vitals and nursing note reviewed.  Constitutional:      General: She is not in acute distress.    Appearance: She is well-developed.  HENT:     Head: Normocephalic and atraumatic.  Eyes:     Conjunctiva/sclera: Conjunctivae normal.  Cardiovascular:     Rate and Rhythm: Normal rate and regular rhythm.     Heart sounds: No murmur heard. Pulmonary:     Effort: Pulmonary effort is normal. No respiratory distress.     Breath sounds: Normal breath sounds.  Abdominal:     Palpations: Abdomen is soft.     Tenderness: There is no abdominal tenderness.  Musculoskeletal:        General: No swelling.       Arms:     Cervical back: Neck supple.  Skin:    General: Skin is warm and dry.     Capillary Refill: Capillary refill takes less than 2 seconds.  Neurological:     Mental Status: She is alert.  Psychiatric:        Mood and Affect: Mood normal.      UC Treatments / Results  Labs (all labs ordered are  listed, but only abnormal results are displayed) Labs Reviewed - No data to display  EKG   Radiology No results found.  Procedures Laceration Repair  Date/Time: 04/19/2023 2:05 PM  Performed by: Landis Martins, PA-C Authorized by: Landis Martins,  PA-C   Consent:    Consent obtained:  Verbal   Consent given by:  Patient   Risks, benefits, and alternatives were discussed: yes     Risks discussed:  Infection, pain, need for additional repair, nerve damage and poor wound healing   Alternatives discussed:  No treatment Universal protocol:    Procedure explained and questions answered to patient or proxy's satisfaction: yes     Immediately prior to procedure, a time out was called: yes     Patient identity confirmed:  Verbally with patient Anesthesia:    Anesthesia method:  Local infiltration   Local anesthetic:  Lidocaine 2% w/o epi Laceration details:    Location:  Finger   Finger location:  R ring finger Pre-procedure details:    Preparation:  Patient was prepped and draped in usual sterile fashion Exploration:    Hemostasis achieved with:  Direct pressure   Wound exploration: wound explored through full range of motion and entire depth of wound visualized   Treatment:    Area cleansed with:  Povidone-iodine   Amount of cleaning:  Extensive   Irrigation solution:  Sterile saline   Irrigation method:  Syringe   Debridement:  None   Undermining:  None   Scar revision: no   Skin repair:    Repair method:  Sutures   Suture size:  5-0   Suture material:  Prolene   Suture technique:  Simple interrupted Approximation:    Approximation:  Close Repair type:    Repair type:  Intermediate Post-procedure details:    Dressing:  Bulky dressing   Procedure completion:  Tolerated  (including critical care time)  Medications Ordered in UC Medications  Tdap (BOOSTRIX) injection 0.5 mL (0.5 mLs Intramuscular Given 04/19/23 1401)    Initial Impression / Assessment and  Plan / UC Course  I have reviewed the triage vital signs and the nursing notes.  Pertinent labs & imaging results that were available during my care of the patient were reviewed by me and considered in my medical decision making (see chart for details).     Right hand 4th digit laceration: The area was cleansed and laceration repair done.  Five 5-0 sutures are placed in a simple interrupted fashion.  Will start the patient on doxycycline for 7 days and call in mupirocin.  Would leave the sutures in place for at least 10 days especially given that the patient is going out of the country for 3 weeks on Wednesday.  Avoid any heavy activity with that finger for at least 7 days.  Return to clinic if symptoms worsen, fail to improve or complications from the finger laceration repair occur. Final Clinical Impressions(s) / UC Diagnoses   Final diagnoses:  Laceration of right ring finger without foreign body without damage to nail, initial encounter     Discharge Instructions      Leave dressing in place until tomorrow. Remove dressing tomorrow and may wash with soap and water but do not submerge until the sutures are removed Avoid heavy activity with the right hand for 7 days Mupirocin twice daily to area as needed Start Doxycycline twice daily for 7 days. Take it with food Sutures out in 10 days   ED Prescriptions     Medication Sig Dispense Auth. Provider   doxycycline (VIBRAMYCIN) 100 MG capsule Take 1 capsule (100 mg total) by mouth 2 (two) times daily for 7 days. 14 capsule Tamarick Kovalcik A, PA-C   mupirocin ointment (BACTROBAN) 2 % Apply  1 Application topically 2 (two) times daily. 22 g Landis Martins, [place]      PDMP not reviewed this encounter.   Quintella Reichert 04/19/23 1412    Landis Martins, PA-C 04/19/23 1415

## 2023-05-08 ENCOUNTER — Encounter: Payer: Self-pay | Admitting: Sports Medicine

## 2023-05-16 ENCOUNTER — Other Ambulatory Visit (HOSPITAL_COMMUNITY): Payer: Medicare Other

## 2023-05-16 ENCOUNTER — Ambulatory Visit (HOSPITAL_COMMUNITY)
Admission: RE | Admit: 2023-05-16 | Discharge: 2023-05-16 | Disposition: A | Discharge status: Home / Self Care | Payer: Medicare Other | Source: Ambulatory Visit | Attending: Sports Medicine | Admitting: Sports Medicine

## 2023-05-16 DIAGNOSIS — R011 Cardiac murmur, unspecified: Secondary | ICD-10-CM | POA: Diagnosis present

## 2023-05-16 DIAGNOSIS — I340 Nonrheumatic mitral (valve) insufficiency: Secondary | ICD-10-CM | POA: Insufficient documentation

## 2023-05-16 LAB — ECHOCARDIOGRAM COMPLETE
AR max vel: 2.56 cm2
AV Area VTI: 2.44 cm2
AV Area mean vel: 2.44 cm2
AV Mean grad: 2 mm[Hg]
AV Peak grad: 4.4 mm[Hg]
Ao pk vel: 1.05 m/s
Area-P 1/2: 4.17 cm2
S' Lateral: 2.5 cm

## 2023-05-19 ENCOUNTER — Encounter: Payer: Self-pay | Admitting: Sports Medicine

## 2023-05-19 ENCOUNTER — Ambulatory Visit: Payer: Medicare Other | Admitting: Sports Medicine

## 2023-05-19 DIAGNOSIS — R253 Fasciculation: Secondary | ICD-10-CM | POA: Diagnosis not present

## 2023-05-19 DIAGNOSIS — E559 Vitamin D deficiency, unspecified: Secondary | ICD-10-CM

## 2023-05-19 DIAGNOSIS — M8580 Other specified disorders of bone density and structure, unspecified site: Secondary | ICD-10-CM | POA: Diagnosis not present

## 2023-05-19 DIAGNOSIS — I340 Nonrheumatic mitral (valve) insufficiency: Secondary | ICD-10-CM

## 2023-05-19 MED ORDER — CALCIUM CARBONATE 600 MG PO TABS
600.0000 mg | ORAL_TABLET | Freq: Two times a day (BID) | ORAL | Status: DC
Start: 2023-05-19 — End: 2023-12-19

## 2023-05-19 MED ORDER — VITAMIN D3 20 MCG (800 UNIT) PO TABS
1.0000 | ORAL_TABLET | Freq: Every day | ORAL | 3 refills | Status: AC
Start: 2023-05-19 — End: ?

## 2023-05-19 NOTE — Progress Notes (Signed)
    Procedures performed today:    None.  Independent interpretation of notes and tests performed by another provider:   None.  Brief History, Exam, Impression, and Recommendations:    Osteopenia Added calcium and vitamin D, patient already taking dietary calcium so she will just need vitamin D supplementation.  Mild mitral regurgitation Systolic murmur heard at the last visit is not appreciable today. Echocardiogram did show a structurally normal mitral valve with mild mitral regurgitation which is essentially a normal finding at age, asymptomatic, no further evaluation or intervention needed.  Fasciculations Left-sided thumb tremor, I am unable to reproduce this with intention, no rest tremor. She does endorse this is worse with fatigue, suspect this is more of fasciculation.    ____________________________________________ Ihor Austin. Benjamin Stain, M.D., ABFM., CAQSM., AME. Primary Care and Sports Medicine [facility]  Adjunct Professor of Family Medicine  [employer]  Restaurant manager, fast food

## 2023-05-19 NOTE — Assessment & Plan Note (Signed)
Systolic murmur heard at the last visit is not appreciable today. Echocardiogram did show a structurally normal mitral valve with mild mitral regurgitation which is essentially a normal finding at age, asymptomatic, no further evaluation or intervention needed.

## 2023-05-19 NOTE — Assessment & Plan Note (Signed)
Added calcium and vitamin D, patient already taking dietary calcium so she will just need vitamin D supplementation.

## 2023-05-19 NOTE — Assessment & Plan Note (Signed)
Left-sided thumb tremor, I am unable to reproduce this with intention, no rest tremor. She does endorse this is worse with fatigue, suspect this is more of fasciculation.

## 2023-05-21 LAB — VITAMIN D 25 HYDROXY (VIT D DEFICIENCY, FRACTURES): Vit D, 25-Hydroxy: 29.3 ng/mL — ABNORMAL LOW (ref 30.0–100.0)

## 2023-05-27 ENCOUNTER — Encounter (INDEPENDENT_AMBULATORY_CARE_PROVIDER_SITE_OTHER): Payer: Medicare Other | Admitting: Ophthalmology

## 2023-10-11 ENCOUNTER — Encounter: Payer: Self-pay | Admitting: Sports Medicine

## 2023-10-11 DIAGNOSIS — M47816 Spondylosis without myelopathy or radiculopathy, lumbar region: Secondary | ICD-10-CM

## 2023-10-13 MED ORDER — IBUPROFEN 800 MG PO TABS
800.0000 mg | ORAL_TABLET | Freq: Three times a day (TID) | ORAL | 3 refills | Status: DC | PRN
Start: 2023-10-13 — End: 2024-01-27

## 2023-10-13 NOTE — Telephone Encounter (Signed)
 Did not see ibuprofen in past or current med list.

## 2023-10-29 IMAGING — DX DG ANKLE COMPLETE 3+V*L*
3 series · 3 of 3 positions shown · non-contrast
Comparison: None.

CLINICAL DATA: Severe pain and swelling. Cellulitis on left ankle.
Nonhealing.

EXAM:
LEFT ANKLE COMPLETE - 3+ VIEW

[ankle ap]
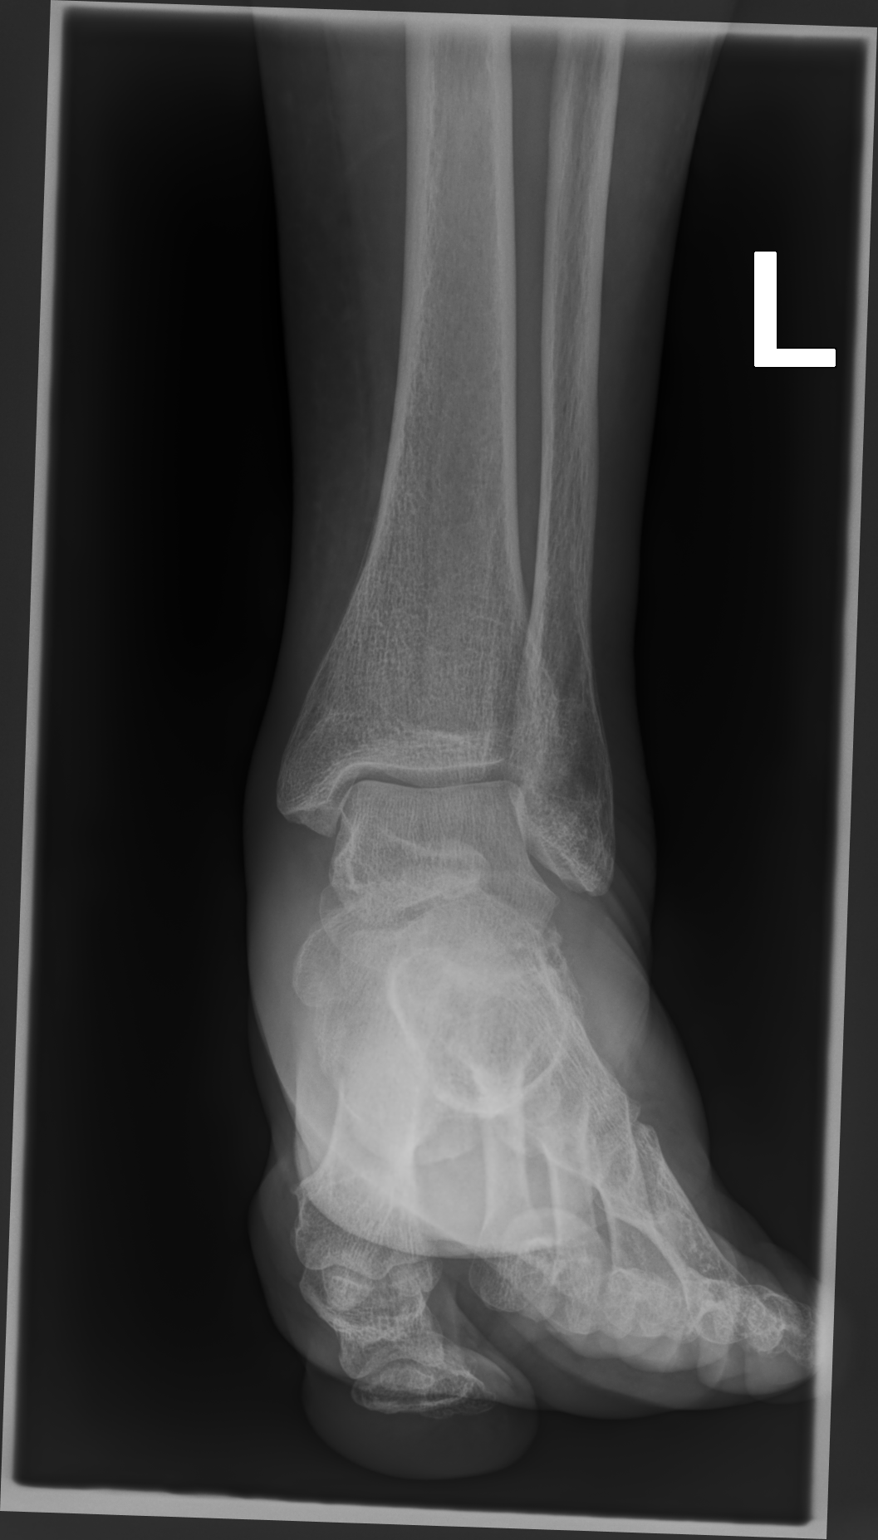

[ankle medial oblique]
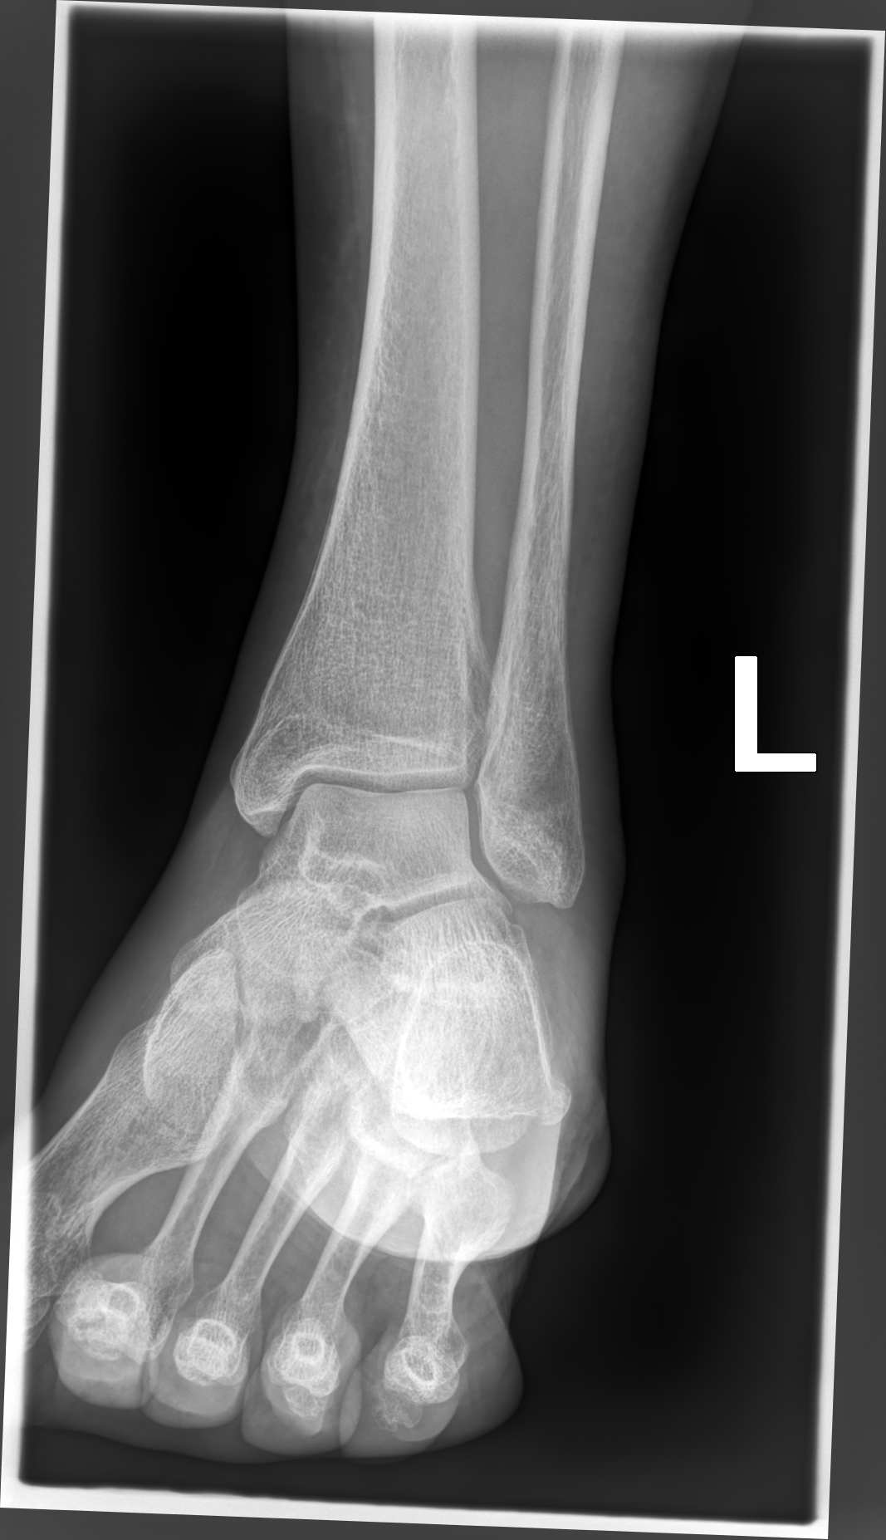

[ankle lat]
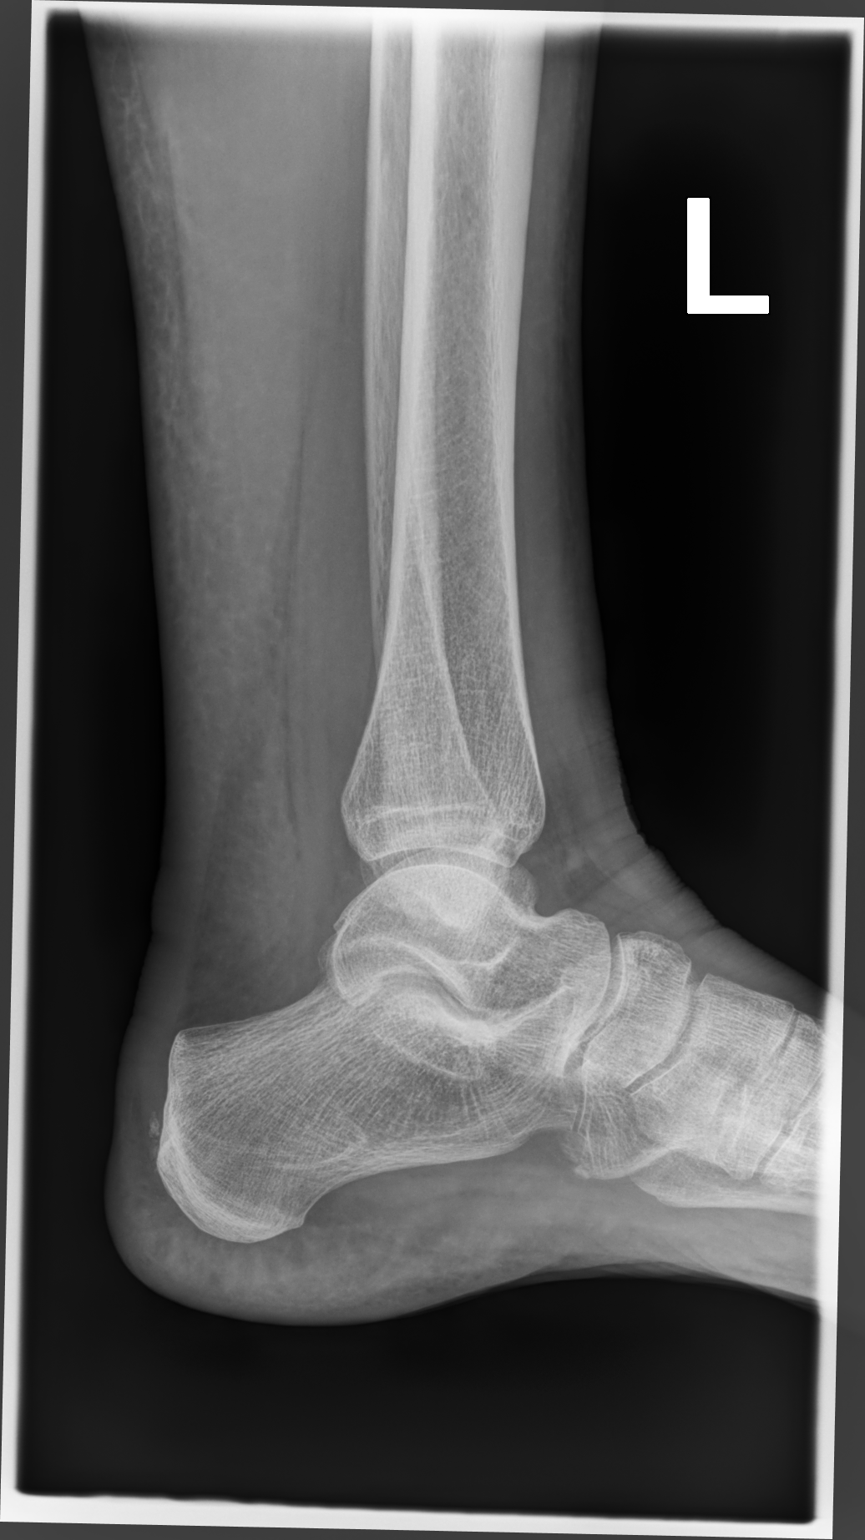

[3 of 3 positions shown; findings below may reference images not displayed]

FINDINGS: Mild diffuse ankle soft tissue swelling. The ankle mortise is
symmetric and intact. Joint spaces are preserved. No acute fracture
is seen. No dislocation. Minimal enthesopathic change at the
Achilles insertion on the calcaneus.

No cortical erosion is seen.  No subcutaneous air.
IMPRESSION: :
IMPRESSION: 1. Mild ankle soft tissue swelling.
2. No cortical erosion to indicate acute osteomyelitis.

## 2023-12-12 ENCOUNTER — Other Ambulatory Visit: Payer: Self-pay | Admitting: *Deleted

## 2023-12-12 DIAGNOSIS — I728 Aneurysm of other specified arteries: Secondary | ICD-10-CM

## 2023-12-19 ENCOUNTER — Encounter: Payer: Self-pay | Admitting: Physician Assistant

## 2023-12-19 ENCOUNTER — Ambulatory Visit: Attending: Physician Assistant | Admitting: Physician Assistant

## 2023-12-19 ENCOUNTER — Ambulatory Visit (HOSPITAL_COMMUNITY)
Admission: RE | Admit: 2023-12-19 | Discharge: 2023-12-19 | Disposition: A | Discharge status: Home / Self Care | Source: Ambulatory Visit | Attending: Vascular Surgery | Admitting: Vascular Surgery

## 2023-12-19 VITALS — BP 112/62 | HR 79 | Temp 98.0°F | Wt 120.8 lb

## 2023-12-19 DIAGNOSIS — I728 Aneurysm of other specified arteries: Secondary | ICD-10-CM | POA: Insufficient documentation

## 2023-12-19 NOTE — Progress Notes (Signed)
 [facility] [place] HISTORY AND PHYSICAL   Christy Cruz is a pleasant 73 y.o. female who I last saw on 11/14/2021.  She had been hospitalized in March 2023 with left lower extremity pain, fevers, and chills.  She likely had an insect bite with cellulitis.  During that hospitalization she had a CT of the abdomen and pelvis.  An incidental finding was a 1.3 cm splenic artery aneurysm.   She is asymptomatic for abdominal pain, post prandial pain or lumbar pain.   She exercises and eat healthy.  She enjoys travel abroad and is going to [place] soon.  She is not on prescription medications and denies DM, CAD or PAD.       Past Surgical History:  Procedure Laterality Date   OOPHORECTOMY     TONSILLECTOMY      ROS:   General:  No weight loss, Fever, chills  HEENT: No recent headaches, no nasal bleeding, no visual changes, no sore throat  Neurologic: No dizziness, blackouts, seizures. No recent symptoms of stroke or mini- stroke. No recent episodes of slurred speech, or temporary blindness.  Cardiac: No recent episodes of chest pain/pressure, no shortness of breath at rest.  No shortness of breath with exertion.  Denies history of atrial fibrillation or irregular heartbeat  Vascular: No history of rest pain in feet.  No history of claudication.  No history of non-healing ulcer, No history of DVT   Pulmonary: No home oxygen, no productive cough, no hemoptysis,  No asthma or wheezing  Musculoskeletal:  [ ]  Arthritis, [ ]  Low back pain,  [ ]  Joint pain  Hematologic:No history of hypercoagulable state.  No history of easy bleeding.  No history of anemia  Gastrointestinal: No hematochezia or melena,  No gastroesophageal reflux, no trouble swallowing  Urinary: [ ]  chronic Kidney disease, [ ]  on HD - [ ]  MWF or [ ]  TTHS, [ ]  Burning with urination, [ ]  Frequent urination, [ ]  Difficulty urinating;   Skin: No rashes  Psychological: No history of anxiety,  No history of  depression  Social History Social History   Tobacco Use   Smoking status: Never   Smokeless tobacco: Never  Vaping Use   Vaping status: Never Used  Substance Use Topics   Alcohol use: Never   Drug use: Never    Family History No family history on file.  Allergies  No Known Allergies   Current Outpatient Medications  Medication Sig Dispense Refill   Cholecalciferol (VITAMIN D3) 20 MCG (800 UNIT) TABS Take 1 tablet by mouth daily. 90 tablet 3   ibuprofen  (ADVIL ) 800 MG tablet Take 1 tablet (800 mg total) by mouth every 8 (eight) hours as needed. 100 tablet 3   Multiple Vitamins-Minerals (ICAPS AREDS 2 PO) Take 1 capsule by mouth 2 (two) times daily.     No current facility-administered medications for this visit.    Physical Examination  Vitals:   12/19/23 0904  BP: 112/62  Pulse: 79  Temp: 98 F (36.7 C)  TempSrc: Temporal  SpO2: 95%  Weight: 120 lb 12.8 oz (54.8 kg)    Body mass index is 20.1 kg/m.  General:  Alert and oriented, no acute distress HEENT: Normal Neck: No bruit or JVD Pulmonary: Clear to auscultation bilaterally Cardiac: Regular Rate and Rhythm without murmur Abdomen: Soft, non-tender, non-distended, no mass, no scars Skin: No rash Musculoskeletal: No deformity or edema  Neurologic: Upper and lower extremity motor 5/5 and symmetric  DATA:  Duplex Findings:  +----------------------+--------+--------+------+------------+  Mesenteric           PSV cm/sEDV cm/sPlaque  Comments    +----------------------+--------+--------+------+------------+  Aorta Prox               92                               +----------------------+--------+--------+------+------------+  Celiac Artery Origin    190                               +----------------------+--------+--------+------+------------+  Celiac Artery Proximal  150                               +----------------------+--------+--------+------+------------+  SMA  Origin              211                 slight curve  +----------------------+--------+--------+------+------------+  SMA Mid                 182                               +----------------------+--------+--------+------+------------+  Splenic                127                               +----------------------+--------+--------+------+------------+      Summary:  Mesenteric:  Normal Superior Mesenteric artery and Celiac artery findings.  Splenic artery is thought to measure 1.48 x 0.81 cm.   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 A ruptured SAA can cause sudden, severe abdominal pain, shock, and hemodynamic instability.  The Splenic artery is thought to measure 1.48 x 0.81 cm. No vascular intervention is needed.  She is stable < 3.0 cm.  I will schedule her to return in 2 years for surveillance.      Rocky Cipro PA-C [facility] [place] Office: [phone]  MD in clinic [place]

## 2024-01-16 LAB — HM MAMMOGRAPHY

## 2024-01-19 ENCOUNTER — Encounter: Payer: Self-pay | Admitting: Sports Medicine

## 2024-01-26 ENCOUNTER — Ambulatory Visit: Admitting: Family Medicine

## 2024-01-27 ENCOUNTER — Ambulatory Visit: Admitting: Sports Medicine

## 2024-01-27 ENCOUNTER — Ambulatory Visit: Attending: Sports Medicine

## 2024-01-27 VITALS — BP 103/66 | HR 85 | Resp 20 | Ht 65.0 in

## 2024-01-27 DIAGNOSIS — R002 Palpitations: Secondary | ICD-10-CM | POA: Diagnosis not present

## 2024-01-27 DIAGNOSIS — R739 Hyperglycemia, unspecified: Secondary | ICD-10-CM | POA: Diagnosis not present

## 2024-01-27 DIAGNOSIS — L816 Other disorders of diminished melanin formation: Secondary | ICD-10-CM

## 2024-01-27 DIAGNOSIS — E559 Vitamin D deficiency, unspecified: Secondary | ICD-10-CM | POA: Diagnosis not present

## 2024-01-27 MED ORDER — CLOTRIMAZOLE 1 % EX CREA
1.0000 | TOPICAL_CREAM | Freq: Two times a day (BID) | CUTANEOUS | 11 refills | Status: DC
Start: 2024-01-27 — End: 2024-05-19

## 2024-01-27 NOTE — Assessment & Plan Note (Addendum)
 Christy Cruz does have a few areas of hypopigmentation across her skin, mostly arms and back. This does not have the classic appearance of tinea versicolor however it is reasonable to attempt a single course of treatment. We will start with clotrimazole topical twice daily to 3 times daily. She understands that the actual change in the appearance of the skin can take months. She will have her husband take a picture of her back and arms so that we can compare in a few months.

## 2024-01-27 NOTE — Assessment & Plan Note (Addendum)
 Very pleasant 73 year old female, for the past several months she has had increasing sensation of palpitations when laying flat at night, she denies any chest pain, shortness of breath, during these episodes, no presyncope. She has no chest pain, shortness of breath, nausea, diaphoresis on exertion. She had an echocardiogram recently that was normal. Does have a family member with atrial fibrillation. Her exam is normal today, I really cannot hear the murmur that we have heard previously. We will get some labs. ECG today was normal. Adding a Zio patch for 3 weeks. We can follow-up to go over results. Ultimately I do think her palpitations are related to increased venous return with laying flat, the Frank-Starling effect, and the sensation of one's own heartbeat which can be normal.  Update: Monitoring is reassuring, there were a few episodes of fast and slow heart rates but these were not associated with symptoms, symptoms were mostly associated with normal sinus rhythm, a few extra atrial and ventricular contractions, none of this is life-threatening or needs treatment, if symptoms are intolerable then the treatment is a beta-blocker pill.

## 2024-01-27 NOTE — Progress Notes (Addendum)
    Procedures performed today:    Twelve-lead ECG performed and interpreted, normal sinus rhythm, normal axis  Independent interpretation of notes and tests performed by another provider:   None.  Brief History, Exam, Impression, and Recommendations:    Palpitations Very pleasant 73 year old female, for the past several months she has had increasing sensation of palpitations when laying flat at night, she denies any chest pain, shortness of breath, during these episodes, no presyncope. She has no chest pain, shortness of breath, nausea, diaphoresis on exertion. She had an echocardiogram recently that was normal. Does have a family member with atrial fibrillation. Her exam is normal today, I really cannot hear the murmur that we have heard previously. We will get some labs. ECG today was normal. Adding a Zio patch for 3 weeks. We can follow-up to go over results. Ultimately I do think her palpitations are related to increased venous return with laying flat, the Frank-Starling effect, and the sensation of one's own heartbeat which can be normal.  Update: Monitoring is reassuring, there were a few episodes of fast and slow heart rates but these were not associated with symptoms, symptoms were mostly associated with normal sinus rhythm, a few extra atrial and ventricular contractions, none of this is life-threatening or needs treatment, if symptoms are intolerable then the treatment is a beta-blocker pill.   Skin hypopigmentation Sur[place] does have a few areas of hypopigmentation across her skin, mostly arms and back. This does not have the classic appearance of tinea versicolor however it is reasonable to attempt a single course of treatment. We will start with clotrimazole  topical twice daily to 3 times daily. She understands that the actual change in the appearance of the skin can take months. She will have her husband take a picture of her back and arms so that we can compare in a few  months.    ____________________________________________ Debby PARAS. Curtis, M.D., ABFM., CAQSM., AME. Primary Care and Sports Medicine [facility]  Adjunct Professor of [facility] Medicine  [employer]  Restaurant manager, fast food

## 2024-01-27 NOTE — Progress Notes (Unsigned)
 EP to read.

## 2024-01-28 ENCOUNTER — Ambulatory Visit: Payer: Self-pay | Admitting: Sports Medicine

## 2024-01-28 LAB — COMPREHENSIVE METABOLIC PANEL WITH GFR
ALT: 18 IU/L (ref 0–32)
AST: 27 IU/L (ref 0–40)
Albumin: 4.2 g/dL (ref 3.8–4.8)
Alkaline Phosphatase: 97 IU/L (ref 44–121)
BUN/Creatinine Ratio: 25 (ref 12–28)
BUN: 21 mg/dL (ref 8–27)
Bilirubin Total: 1.3 mg/dL — ABNORMAL HIGH (ref 0.0–1.2)
CO2: 22 mmol/L (ref 20–29)
Calcium: 9.4 mg/dL (ref 8.7–10.3)
Chloride: 100 mmol/L (ref 96–106)
Creatinine, Ser: 0.84 mg/dL (ref 0.57–1.00)
Globulin, Total: 2.4 g/dL (ref 1.5–4.5)
Glucose: 96 mg/dL (ref 70–99)
Potassium: 4.7 mmol/L (ref 3.5–5.2)
Sodium: 138 mmol/L (ref 134–144)
Total Protein: 6.6 g/dL (ref 6.0–8.5)
eGFR: 74 mL/min/{1.73_m2} (ref >59)

## 2024-01-28 LAB — VITAMIN D 25 HYDROXY (VIT D DEFICIENCY, FRACTURES): Vit D, 25-Hydroxy: 34.4 ng/mL (ref 30.0–100.0)

## 2024-01-28 LAB — HEMOGLOBIN A1C
Est. average glucose Bld gHb Est-mCnc: 120 mg/dL
Hgb A1c MFr Bld: 5.8 % — ABNORMAL HIGH (ref 4.8–5.6)

## 2024-01-28 LAB — LIPID PANEL
Chol/HDL Ratio: 2.5 ratio (ref 0.0–4.4)
Cholesterol, Total: 186 mg/dL (ref 100–199)
HDL: 73 mg/dL (ref >39)
LDL Chol Calc (NIH): 95 mg/dL (ref 0–99)
Triglycerides: 103 mg/dL (ref 0–149)
VLDL Cholesterol Cal: 18 mg/dL (ref 5–40)

## 2024-01-28 LAB — CBC
Hematocrit: 42.3 % (ref 34.0–46.6)
Hemoglobin: 13.7 g/dL (ref 11.1–15.9)
MCH: 29.7 pg (ref 26.6–33.0)
MCHC: 32.4 g/dL (ref 31.5–35.7)
MCV: 92 fL (ref 79–97)
Platelets: 204 10*3/uL (ref 150–450)
RBC: 4.62 x10E6/uL (ref 3.77–5.28)
RDW: 13.4 % (ref 11.7–15.4)
WBC: 8.5 10*3/uL (ref 3.4–10.8)

## 2024-01-28 LAB — TSH: TSH: 0.783 u[IU]/mL (ref 0.450–4.500)

## 2024-01-28 LAB — D-DIMER, QUANTITATIVE: D-DIMER: 0.36 mg{FEU}/L (ref 0.00–0.49)

## 2024-02-02 ENCOUNTER — Telehealth: Payer: Self-pay

## 2024-02-02 NOTE — Telephone Encounter (Signed)
 Copied from CRM [id number]. Topic: Clinical - Medication Question >> Jan 29, 2024 10:41 AM Mercer PEDLAR wrote: Reason for CRM: Patient calling regarding prescriptions which were sent of 01/27/24 during her appointment. She stated that the pharmacy only had clobetasol cream (TEMOVATE) 0.05 % but she was told that she will also get clotrimazole  (LOTRIMIN ) 1 % cream. Patient would like a callback to clarify if both are needed and how to use them.

## 2024-02-02 NOTE — Telephone Encounter (Signed)
 Left message for a return call

## 2024-02-02 NOTE — Telephone Encounter (Signed)
 Pt called back to inform Jon that her issue has been resolved. No need for a call back.

## 2024-02-11 NOTE — Addendum Note (Signed)
 Addended by: BONNY JON DEL on: 02/11/2024 02:59 PM   Modules accepted: Orders

## 2024-03-02 ENCOUNTER — Encounter: Payer: Self-pay | Admitting: Sports Medicine

## 2024-03-04 DIAGNOSIS — R002 Palpitations: Secondary | ICD-10-CM

## 2024-03-04 NOTE — Telephone Encounter (Signed)
 Called patient . She had just spoken with Dr. Vicci nurse . She did go ahead and cancel the appointment for October.

## 2024-03-22 ENCOUNTER — Telehealth: Admitting: Family Medicine

## 2024-03-22 DIAGNOSIS — L255 Unspecified contact dermatitis due to plants, except food: Secondary | ICD-10-CM

## 2024-03-22 MED ORDER — PREDNISONE 10 MG PO TABS
10.0000 mg | ORAL_TABLET | Freq: Every day | ORAL | 0 refills | Status: DC
Start: 2024-03-22 — End: 2024-04-03

## 2024-03-22 NOTE — Progress Notes (Signed)
 E Visit for Rash  We are sorry that you are not feeling well. Here is how we plan to help!  I will send a Prednisone  10 mg- 12 day dose pack to take as directed.    HOME CARE:  Take cool showers and avoid direct sunlight. Apply cool compress or wet dressings. Take a bath in an oatmeal bath.  Sprinkle content of one Aveeno packet under running faucet with comfortably warm water.  Bathe for 15-20 minutes, 1-2 times daily.  Pat dry with a towel. Do not rub the rash. Use hydrocortisone cream. Take an antihistamine like Benadryl for widespread rashes that itch.  The adult dose of Benadryl is 25-50 mg by mouth 4 times daily. Caution:  This type of medication may cause sleepiness.  Do not drink alcohol, drive, or operate dangerous machinery while taking antihistamines.  Do not take these medications if you have prostate enlargement.  Read package instructions thoroughly on all medications that you take.  GET HELP RIGHT AWAY IF:  Symptoms don't go away after treatment. Severe itching that persists. If you rash spreads or swells. If you rash begins to smell. If it blisters and opens or develops a yellow-brown crust. You develop a fever. You have a sore throat. You become short of breath.  MAKE SURE YOU:  Understand these instructions. Will watch your condition. Will get help right away if you are not doing well or get worse.  Thank you for choosing an e-visit.  Your e-visit answers were reviewed by a board certified advanced clinical practitioner to complete your personal care plan. Depending upon the condition, your plan could have included both over the counter or prescription medications.  Please review your pharmacy choice. Make sure the pharmacy is open so you can pick up prescription now. If there is a problem, you may contact your provider through [employer] and have the prescription routed to another pharmacy.  Your safety is important to us . If you have drug allergies  check your prescription carefully.   For the next 24 hours you can use MyChart to ask questions about today's visit, request a non-urgent call back, or ask for a work or school excuse. You will get an email in the next two days asking about your experience. I hope that your e-visit has been valuable and will speed your recovery.    have provided 5 minutes of non face to face time during this encounter for chart review and documentation.

## 2024-03-23 MED ORDER — PREDNISONE 10 MG PO TABS
ORAL_TABLET | ORAL | 0 refills | Status: AC
Start: 2024-03-23 — End: 2024-04-04

## 2024-03-23 NOTE — Addendum Note (Signed)
 Addended by: GLADIS ELSIE BROCKS on: 03/23/2024 01:20 PM   Modules accepted: Orders

## 2024-03-30 ENCOUNTER — Encounter: Payer: Self-pay | Admitting: Sports Medicine

## 2024-04-28 ENCOUNTER — Ambulatory Visit: Admitting: Sports Medicine

## 2024-05-19 ENCOUNTER — Encounter: Payer: Medicare Other | Admitting: Sports Medicine

## 2024-05-19 ENCOUNTER — Ambulatory Visit: Admitting: Physician Assistant

## 2024-05-19 VITALS — BP 132/70 | HR 64 | Ht 65.0 in | Wt 120.0 lb

## 2024-05-19 DIAGNOSIS — Z131 Encounter for screening for diabetes mellitus: Secondary | ICD-10-CM

## 2024-05-19 DIAGNOSIS — Z0000 Encounter for general adult medical examination without abnormal findings: Secondary | ICD-10-CM | POA: Diagnosis not present

## 2024-05-19 DIAGNOSIS — Z780 Asymptomatic menopausal state: Secondary | ICD-10-CM | POA: Diagnosis not present

## 2024-05-19 DIAGNOSIS — Z13220 Encounter for screening for lipoid disorders: Secondary | ICD-10-CM

## 2024-05-19 DIAGNOSIS — M8580 Other specified disorders of bone density and structure, unspecified site: Secondary | ICD-10-CM

## 2024-05-19 NOTE — Patient Instructions (Signed)
 Health Maintenance After Age 72 After age 27, you are at a higher risk for certain long-term diseases and infections as well as injuries from falls. Falls are a major cause of broken bones and head injuries in people who are older than age 73. Getting regular preventive care can help to keep you healthy and well. Preventive care includes getting regular testing and making lifestyle changes as recommended by your health care provider. Talk with your health care provider about: Which screenings and tests you should have. A screening is a test that checks for a disease when you have no symptoms. A diet and exercise plan that is right for you. What should I know about screenings and tests to prevent falls? Screening and testing are the best ways to find a health problem early. Early diagnosis and treatment give you the best chance of managing medical conditions that are common after age 90. Certain conditions and lifestyle choices may make you more likely to have a fall. Your health care provider may recommend: Regular vision checks. Poor vision and conditions such as cataracts can make you more likely to have a fall. If you wear glasses, make sure to get your prescription updated if your vision changes. Medicine review. Work with your health care provider to regularly review all of the medicines you are taking, including over-the-counter medicines. Ask your health care provider about any side effects that may make you more likely to have a fall. Tell your health care provider if any medicines that you take make you feel dizzy or sleepy. Strength and balance checks. Your health care provider may recommend certain tests to check your strength and balance while standing, walking, or changing positions. Foot health exam. Foot pain and numbness, as well as not wearing proper footwear, can make you more likely to have a fall. Screenings, including: Osteoporosis screening. Osteoporosis is a condition that causes  the bones to get weaker and break more easily. Blood pressure screening. Blood pressure changes and medicines to control blood pressure can make you feel dizzy. Depression screening. You may be more likely to have a fall if you have a fear of falling, feel depressed, or feel unable to do activities that you used to do. Alcohol  use screening. Using too much alcohol  can affect your balance and may make you more likely to have a fall. Follow these instructions at home: Lifestyle Do not drink alcohol  if: Your health care provider tells you not to drink. If you drink alcohol : Limit how much you have to: 0-1 drink a day for women. 0-2 drinks a day for men. Know how much alcohol  is in your drink. In the [place] one drink equals one 12 oz bottle of beer (355 mL), one 5 oz glass of wine (148 mL), or one 1 oz glass of hard liquor (44 mL). Do not use any products that contain nicotine or tobacco. These products include cigarettes, chewing tobacco, and vaping devices, such as e-cigarettes. If you need help quitting, ask your health care provider. Activity  Follow a regular exercise program to stay fit. This will help you maintain your balance. Ask your health care provider what types of exercise are appropriate for you. If you need a cane or walker, use it as recommended by your health care provider. Wear supportive shoes that have nonskid soles. Safety  Remove any tripping hazards, such as rugs, cords, and clutter. Install safety equipment such as grab bars in bathrooms and safety rails on stairs. Keep rooms and walkways  well-lit. General instructions Talk with your health care provider about your risks for falling. Tell your health care provider if: You fall. Be sure to tell your health care provider about all falls, even ones that seem minor. You feel dizzy, tiredness (fatigue), or off-balance. Take over-the-counter and prescription medicines only as told by your health care provider. These include  supplements. Eat a healthy diet and maintain a healthy weight. A healthy diet includes low-fat dairy products, low-fat (lean) meats, and fiber from whole grains, beans, and lots of fruits and vegetables. Stay current with your vaccines. Schedule regular health, dental, and eye exams. Summary Having a healthy lifestyle and getting preventive care can help to protect your health and wellness after age 15. Screening and testing are the best way to find a health problem early and help you avoid having a fall. Early diagnosis and treatment give you the best chance for managing medical conditions that are more common for people who are older than age 42. Falls are a major cause of broken bones and head injuries in people who are older than age 64. Take precautions to prevent a fall at home. Work with your health care provider to learn what changes you can make to improve your health and wellness and to prevent falls. This information is not intended to replace advice given to you by your health care provider. Make sure you discuss any questions you have with your health care provider. Document Revised: 12/04/2020 Document Reviewed: 12/04/2020 Elsevier Patient Education  2024 [employer].

## 2024-05-20 ENCOUNTER — Encounter: Payer: Self-pay | Admitting: Physician Assistant

## 2024-05-20 ENCOUNTER — Ambulatory Visit: Payer: Self-pay | Admitting: Physician Assistant

## 2024-05-20 LAB — LIPID PANEL
Chol/HDL Ratio: 2.6 ratio (ref 0.0–4.4)
Cholesterol, Total: 207 mg/dL — ABNORMAL HIGH (ref 100–199)
HDL: 79 mg/dL (ref >39)
LDL Chol Calc (NIH): 114 mg/dL — ABNORMAL HIGH (ref 0–99)
Triglycerides: 77 mg/dL (ref 0–149)
VLDL Cholesterol Cal: 14 mg/dL (ref 5–40)

## 2024-05-20 LAB — CBC WITH DIFFERENTIAL/PLATELET
Basophils Absolute: 0 x10E3/uL (ref 0.0–0.2)
Basos: 1 %
EOS (ABSOLUTE): 0.1 x10E3/uL (ref 0.0–0.4)
Eos: 3 %
Hematocrit: 43.3 % (ref 34.0–46.6)
Hemoglobin: 14.2 g/dL (ref 11.1–15.9)
Immature Grans (Abs): 0 x10E3/uL (ref 0.0–0.1)
Immature Granulocytes: 0 %
Lymphocytes Absolute: 1.3 x10E3/uL (ref 0.7–3.1)
Lymphs: 27 %
MCH: 29.5 pg (ref 26.6–33.0)
MCHC: 32.8 g/dL (ref 31.5–35.7)
MCV: 90 fL (ref 79–97)
Monocytes Absolute: 0.6 x10E3/uL (ref 0.1–0.9)
Monocytes: 13 %
Neutrophils Absolute: 2.7 x10E3/uL (ref 1.4–7.0)
Neutrophils: 56 %
Platelets: 165 x10E3/uL (ref 150–450)
RBC: 4.81 x10E6/uL (ref 3.77–5.28)
RDW: 12.9 % (ref 11.7–15.4)
WBC: 4.8 x10E3/uL (ref 3.4–10.8)

## 2024-05-20 LAB — CMP14+EGFR
ALT: 14 IU/L (ref 0–32)
AST: 22 IU/L (ref 0–40)
Albumin: 4.3 g/dL (ref 3.8–4.8)
Alkaline Phosphatase: 107 IU/L (ref 49–135)
BUN/Creatinine Ratio: 16 (ref 12–28)
BUN: 14 mg/dL (ref 8–27)
Bilirubin Total: 1.7 mg/dL — ABNORMAL HIGH (ref 0.0–1.2)
CO2: 22 mmol/L (ref 20–29)
Calcium: 9.2 mg/dL (ref 8.7–10.3)
Chloride: 100 mmol/L (ref 96–106)
Creatinine, Ser: 0.89 mg/dL (ref 0.57–1.00)
Globulin, Total: 2.4 g/dL (ref 1.5–4.5)
Glucose: 82 mg/dL (ref 70–99)
Potassium: 4.7 mmol/L (ref 3.5–5.2)
Sodium: 141 mmol/L (ref 134–144)
Total Protein: 6.7 g/dL (ref 6.0–8.5)
eGFR: 69 mL/min/1.73 (ref >59)

## 2024-05-20 LAB — VITAMIN B12: Vitamin B-12: 1114 pg/mL (ref 232–1245)

## 2024-05-20 LAB — VITAMIN D 25 HYDROXY (VIT D DEFICIENCY, FRACTURES): Vit D, 25-Hydroxy: 39.3 ng/mL (ref 30.0–100.0)

## 2024-05-20 LAB — TSH: TSH: 1.52 u[IU]/mL (ref 0.450–4.500)

## 2024-05-20 NOTE — Progress Notes (Signed)
 Christy Cruz,   Vitamin D  and b12 look great.  Kidney, liver, glucose stable and good.  Cholesterol up a little from last year.   Your 10 year cardiovascular risk is 12 percent. You are in the window to start a cholesterol lowering drug for prevention due to risk. Thoughts?   SABRASABRAThe 10-year ASCVD risk score (Arnett DK, et al., 2019) is: 12%   Values used to calculate the score:     Age: 73 years     Clincally relevant sex: Female     Is Non-Hispanic African American: No     Diabetic: No     Tobacco smoker: No     Systolic Blood Pressure: 132 mmHg     Is BP treated: No     HDL Cholesterol: 79 mg/dL     Total Cholesterol: 207 mg/dL

## 2024-05-20 NOTE — Progress Notes (Signed)
 Complete physical exam  Patient: Christy Cruz   DOB: [birth year]   73 y.o. Female  MRN: [id number]  Subjective:    Chief Complaint  Patient presents with   Annual Exam    Discussed the use of AI scribe software for clinical note transcription with the patient, who gave verbal consent to proceed.  History of Present Illness Christy Cruz is a 73 year old female who presents for a complete physical exam.  Musculoskeletal health - Osteopenia diagnosed by bone density scan in September 2024 - Engages in strength training exercises - High dietary intake of dairy - Lumbar facet joint pain - Reactive arthritis - No falls reported  Preventive health maintenance - Mammogram in June 2025 was normal - Up to date with pneumonia, shingles, and COVID booster vaccines - Recent influenza vaccination - Cologuard test on February 15, 2023 was negative; next due in 2027 - No recent colonoscopy  Sleep and general well-being - Good sleep quality - No chest pain - No shortness of breath - No swallowing difficulties  Gastrointestinal function - Regular bowel movements occurring daily  Sensory health - Uses hearing aids for the past several years with annual checkups - Annual eye examinations; next appointment scheduled for November - Regular dental visits    Most recent fall risk assessment:    05/20/2024    3:51 PM  Fall Risk   Falls in the past year? 0  Number falls in past yr: 0  Injury with Fall? 0     Most recent depression screenings:    05/19/2024    8:47 AM 12/19/2022    9:02 PM  PHQ 2/9 Scores  PHQ - 2 Score 0 0  PHQ- 9 Score 0 0    Vision:Within last year and Dental: No current dental problems and Receives regular dental care  Patient Active Problem List   Diagnosis Date Noted   Post-menopausal 05/19/2024   Palpitations 01/27/2024   Skin hypopigmentation 01/27/2024   Fasciculations 05/19/2023   Lumbar facet joint syndrome 03/26/2023   Mild mitral  regurgitation 03/26/2023   Travel advice encounter 03/26/2023   Annual physical exam 03/26/2023   Cervicogenic headache 12/11/2021   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 Past Surgical History:  Procedure Laterality Date   OOPHORECTOMY     TONSILLECTOMY     No family history on file. No Known Allergies    Patient Care Team: Antwian Santaana L, PA-C as PCP - General (Family Medicine)   Outpatient Medications Prior to Visit  Medication Sig   Cholecalciferol (VITAMIN D3) 20 MCG (800 UNIT) TABS Take 1 tablet by mouth daily.   Glucosamine-Chondroit-Vit C-Mn (GLUCOSAMINE CHONDROITIN COMPLX) TABS Take 1 tablet by mouth daily.   Multiple Vitamins-Minerals (ICAPS AREDS 2 PO) Take 1 capsule by mouth 2 (two) times daily.   [DISCONTINUED] clobetasol cream (TEMOVATE) 0.05 % Apply 1 Application topically 2 (two) times daily.   [DISCONTINUED] clotrimazole  (LOTRIMIN ) 1 % cream Apply 1 Application topically 2 (two) times daily for 14 days.  No facility-administered medications prior to visit.    ROS        Objective:     BP 132/70   Pulse 64   Ht 5' 5 (1.651 m)   Wt 120 lb (54.4 kg)   SpO2 99%   BMI 19.97 kg/m  BP Readings from Last 3 Encounters:  05/19/24 132/70  01/27/24 103/66  12/19/23 112/62   Wt Readings from Last 3 Encounters:  05/19/24 120 lb (54.4 kg)  12/19/23 120 lb 12.8 oz (54.8 kg)  12/19/22 118 lb (53.5 kg)      Physical Exam       Assessment & Plan:     Routine Health Maintenance and Physical Exam  Immunization History  Administered Date(s) Administered    sv, Bivalent, Protein Subunit Rsvpref,pf (Abrysvo) 04/17/2022   INFLUENZA, HIGH DOSE SEASONAL PF 05/17/2024   Influenza,inj,quad, With Preservative 04/17/2016, 04/29/2017, 05/07/2018, 05/09/2020   Influenza-Unspecified 04/17/2022   Moderna Sars-Covid-2 Vaccination 09/03/2019, 09/24/2019   PFIZER(Purple Top)SARS-COV-2 Vaccination 09/03/2019, 09/24/2019, 05/09/2020   Pfizer(Comirnaty)Fall Seasonal Vaccine 12 years and older 05/17/2024   Pneumococcal Conjugate-13 09/28/2016   Pneumococcal Polysaccharide-23 10/02/2017   Tdap 06/14/2016, 04/19/2023   Zoster Recombinant(Shingrix ) 06/28/2014, 04/28/2024    Health Maintenance  Topic Date Due   Medicare Annual Wellness (AWV)  12/12/2022   Hepatitis C Screening  05/19/2025 (Originally 08/31/1968)   COVID-19 Vaccine (7 - Mixed Product risk 2025-26 season) 11/15/2024   Mammogram  01/15/2026   Fecal DNA (Cologuard)  02/02/2026   DTaP/Tdap/Td (3 - Td or Tdap) 04/18/2033   Pneumococcal Vaccine: 50+ Years  Completed   Influenza Vaccine  Completed   DEXA SCAN  Completed   Zoster Vaccines- Shingrix   Completed   Meningococcal B Vaccine  Aged Out    Discussed health benefits of physical activity, and encouraged her to engage in regular exercise appropriate for her age and condition. Christy Cruz was seen today for annual exam.  Diagnoses and all orders for this visit:  Annual physical exam -     CBC with Differential/Platelet -     CMP14+EGFR -     Lipid panel -     TSH -     VITAMIN D  25 Hydroxy (Vit-D Deficiency, Fractures) -     Vitamin B12  Post-menopausal -     CBC with Differential/Platelet -     CMP14+EGFR -     Lipid panel -     TSH -     VITAMIN D  25 Hydroxy (Vit-D Deficiency, Fractures) -     Vitamin B12  Screening for diabetes mellitus -     CMP14+EGFR  Screening for lipid disorders -     Lipid panel  Osteopenia,  unspecified location -     VITAMIN D  25 Hydroxy (Vit-D Deficiency, Fractures)   Assessment and Plan Assessment & Plan Adult Wellness Visit 73 year old female in good health with up-to-date screenings and negative depression screening. No falls, chest pains, or dyspnea. Regular bowel movements. No prescription medications. Uses hearing aids. Regular ophthalmologic and dental checkups. Good sleep quality. Engages in regular strength training and consumes dairy for bone health. - Order screening labs.  Osteopenia Osteopenia confirmed by bone density scan. Emphasized vitamin D  and calcium  intake. Discussed muscle mass importance for bone protection postmenopausally. - Continue strength training and dairy consumption. - Check vitamin D  and calcium  levels in screening labs. - Screen bone density next year.  General Health Maintenance All vaccinations up to date. Maintains regular exercise and healthy lifestyle. - Continue regular exercise  and healthy lifestyle.    Return in about 1 year (around 05/19/2025).     Marlenne Ridge, PA-C

## 2024-05-21 ENCOUNTER — Encounter: Payer: Self-pay | Admitting: Physician Assistant

## 2024-05-21 DIAGNOSIS — Z5320 Procedure and treatment not carried out because of patient's decision for unspecified reasons: Secondary | ICD-10-CM | POA: Insufficient documentation

## 2025-05-20 ENCOUNTER — Encounter: Admitting: Physician Assistant
